# Patient Record
Sex: Male | Born: 1960 | Race: Black or African American | Hispanic: No | Smoking: Never smoker
Health system: Southern US, Community
[De-identification: ages and names within clinical notes are randomized; demographics above are authoritative.]

## PROBLEM LIST (undated history)

## (undated) DIAGNOSIS — I10 Essential (primary) hypertension: Secondary | ICD-10-CM

## (undated) DIAGNOSIS — E119 Type 2 diabetes mellitus without complications: Secondary | ICD-10-CM

## (undated) DIAGNOSIS — E785 Hyperlipidemia, unspecified: Secondary | ICD-10-CM

## (undated) HISTORY — DX: Hyperlipidemia, unspecified: E78.5

## (undated) HISTORY — DX: Type 2 diabetes mellitus without complications: E11.9

## (undated) HISTORY — DX: Essential (primary) hypertension: I10

---

## 2005-10-08 ENCOUNTER — Emergency Department (HOSPITAL_COMMUNITY): Admission: EM | Admit: 2005-10-08 | Discharge: 2005-10-08 | Payer: Self-pay | Admitting: Emergency Medicine

## 2007-09-03 ENCOUNTER — Emergency Department (HOSPITAL_COMMUNITY): Admission: EM | Admit: 2007-09-03 | Discharge: 2007-09-03 | Payer: Self-pay | Admitting: Family Medicine

## 2007-09-06 ENCOUNTER — Emergency Department (HOSPITAL_COMMUNITY): Admission: EM | Admit: 2007-09-06 | Discharge: 2007-09-06 | Payer: Self-pay | Admitting: Family Medicine

## 2012-04-01 ENCOUNTER — Other Ambulatory Visit: Payer: Self-pay | Admitting: Family Medicine

## 2012-04-17 ENCOUNTER — Other Ambulatory Visit: Payer: Self-pay | Admitting: Internal Medicine

## 2012-04-17 MED ORDER — LISINOPRIL-HYDROCHLOROTHIAZIDE 20-12.5 MG PO TABS: 1.0000 | ORAL_TABLET | Freq: Every day | ORAL | Status: DC

## 2012-04-17 MED ORDER — METFORMIN HCL ER 500 MG PO TB24: 500.0000 mg | ORAL_TABLET | Freq: Two times a day (BID) | ORAL | Status: DC

## 2012-04-17 NOTE — Telephone Encounter (Signed)
Pt is stating that Dr Alycia Rossetti only see him once a year his is almost out of his clonapin and will be out of his 2 other rx in the next couple of weeks he was told by pharmacy that he needs to contact us. So he would like to know what he needs to do, if he needs to be seen in office before rx can be filled, he would like someone to contact him to let him know. He states that he doesn't want to abruptly stop his medication.

## 2012-04-17 NOTE — Telephone Encounter (Signed)
Spoke with patient and he understands need for recheck.  Unable to do so until early next month because of work.  Sent in enough meds for 1 month until he can come in.

## 2012-05-16 ENCOUNTER — Ambulatory Visit (INDEPENDENT_AMBULATORY_CARE_PROVIDER_SITE_OTHER): Payer: 59 | Admitting: Family Medicine

## 2012-05-16 VITALS — BP 168/117 | HR 111 | Temp 98.5°F | Resp 17 | Ht 69.0 in | Wt 268.0 lb

## 2012-05-16 DIAGNOSIS — E119 Type 2 diabetes mellitus without complications: Secondary | ICD-10-CM

## 2012-05-16 DIAGNOSIS — I10 Essential (primary) hypertension: Secondary | ICD-10-CM

## 2012-05-16 LAB — POCT GLYCOSYLATED HEMOGLOBIN (HGB A1C): Hemoglobin A1C: 8.3

## 2012-05-16 LAB — GLUCOSE, POCT (MANUAL RESULT ENTRY): POC Glucose: 131 mg/dl — AB (ref 70–99)

## 2012-05-16 MED ORDER — METFORMIN HCL ER 500 MG PO TB24: 500.0000 mg | ORAL_TABLET | Freq: Two times a day (BID) | ORAL | Status: DC

## 2012-05-16 MED ORDER — CLONIDINE HCL 0.2 MG PO TABS: 0.2000 mg | ORAL_TABLET | Freq: Two times a day (BID) | ORAL | Status: DC

## 2012-05-16 MED ORDER — TRIAMTERENE-HCTZ 37.5-25 MG PO TABS: 1.0000 | ORAL_TABLET | Freq: Every day | ORAL | Status: DC

## 2012-05-16 NOTE — Patient Instructions (Signed)
Let me know if cough persists.  Let's recheck things in 4 months.   Hypertension As your heart beats, it forces blood through your arteries. This force is your blood pressure. If the pressure is too high, it is called hypertension (HTN) or high blood pressure. HTN is dangerous because you may have it and not know it. High blood pressure may mean that your heart has to work harder to pump blood. Your arteries may be narrow or stiff. The extra work puts you at risk for heart disease, stroke, and other problems.  Blood pressure consists of two numbers, a higher number over a lower, 110/72, for example. It is stated as "110 over 72." The ideal is below 120 for the top number (systolic) and under 80 for the bottom (diastolic). Write down your blood pressure today. You should pay close attention to your blood pressure if you have certain conditions such as:  Heart failure.   Prior heart attack.   Diabetes   Chronic kidney disease.   Prior stroke.   Multiple risk factors for heart disease.  To see if you have HTN, your blood pressure should be measured while you are seated with your arm held at the level of the heart. It should be measured at least twice. A one-time elevated blood pressure reading (especially in the Emergency Department) does not mean that you need treatment. There may be conditions in which the blood pressure is different between your right and left arms. It is important to see your caregiver soon for a recheck. Most people have essential hypertension which means that there is not a specific cause. This type of high blood pressure may be lowered by changing lifestyle factors such as:  Stress.   Smoking.   Lack of exercise.   Excessive weight.   Drug/tobacco/alcohol use.   Eating less salt.  Most people do not have symptoms from high blood pressure until it has caused damage to the body. Effective treatment can often prevent, delay or reduce that damage. TREATMENT  When a  cause has been identified, treatment for high blood pressure is directed at the cause. There are a large number of medications to treat HTN. These fall into several categories, and your caregiver will help you select the medicines that are best for you. Medications may have side effects. You should review side effects with your caregiver. If your blood pressure stays high after you have made lifestyle changes or started on medicines,   Your medication(s) may need to be changed.   Other problems may need to be addressed.   Be certain you understand your prescriptions, and know how and when to take your medicine.   Be sure to follow up with your caregiver within the time frame advised (usually within two weeks) to have your blood pressure rechecked and to review your medications.   If you are taking more than one medicine to lower your blood pressure, make sure you know how and at what times they should be taken. Taking two medicines at the same time can result in blood pressure that is too low.  SEEK IMMEDIATE MEDICAL CARE IF:  You develop a severe headache, blurred or changing vision, or confusion.   You have unusual weakness or numbness, or a faint feeling.   You have severe chest or abdominal pain, vomiting, or breathing problems.  MAKE SURE YOU:   Understand these instructions.   Will watch your condition.   Will get help right away if you are  not doing well or get worse.  Document Released: 11/22/2005 Document Revised: 11/11/2011 Document Reviewed: 07/12/2008 [facility] Patient Information 2012 [place], [place].

## 2012-05-16 NOTE — Progress Notes (Signed)
@[email or web address] @  Patient ID: Joe Love MRN: [id number], DOB: 1961/03/31, 51 y.o. Date of Encounter: 05/16/2012, 5:44 PM  Primary Physician: No primary provider on file.  Chief Complaint: Medication refill   HPI: 51 y.o. year old male with history below presents for refill of metformin, and blood pressure medicine Doing well without issues or complaints except for dry cough. Taking medication daily without adverse effects. Has been on medication for over 5 years. He does have a family history of high blood pressure and diabetes his brother having 2 amputations. His sister died last 01-08-2023 but results of the autopsy are still pending. She may have had an ulcer.  Past Medical History  Diagnosis Date  . Hypertension      Home Meds: Prior to Admission medications   Medication Sig Start Date End Date Taking? Authorizing Provider  cloNIDine (CATAPRES) 0.2 MG tablet Take 1 tablet (0.2 mg total) by mouth 2 (two) times daily. 05/16/12  Yes Elvina Sidle, MD  metFORMIN (GLUCOPHAGE-XR) 500 MG 24 hr tablet Take 1 tablet (500 mg total) by mouth 2 (two) times daily. NEEDS OFFICE VISIT FOR MORE 05/16/12  Yes Elvina Sidle, MD  triamterene-hydrochlorothiazide (MAXZIDE-25) 37.5-25 MG per tablet Take 1 each (1 tablet total) by mouth daily. 05/16/12 05/16/13  Elvina Sidle, MD    Allergies: No Known Allergies  History   Social History  . Marital Status: Divorced    Spouse Name: N/A    Number of Children: N/A  . Years of Education: N/A   Occupational History  . Not on file.   Social History Main Topics  . Smoking status: Never Smoker   . Smokeless tobacco: Not on file  . Alcohol Use: Not on file  . Drug Use: Not on file  . Sexually Active: Not on file   Other Topics Concern  . Not on file   Social History Narrative  . No narrative on file     Review of Systems: Constitutional: negative for chills, fever, night sweats, weight changes, or fatigue  HEENT: negative for vision changes,  hearing loss, congestion, rhinorrhea, ST, epistaxis, or sinus pressure Cardiovascular: negative for chest pain or palpitations Respiratory: negative for hemoptysis, wheezing, shortness of breath Abdominal: negative for abdominal pain, nausea, vomiting, diarrhea, or constipation Dermatological: negative for rash Neurologic: negative for headache, dizziness, or syncope All other systems reviewed and are otherwise negative with the exception to those above and in the HPI.   Physical Exam:  BP recheck 120/98 Blood pressure 168/117, pulse 111, temperature 98.5 F (36.9 C), temperature source Oral, resp. rate 17, height 5\' 9"  (1.753 m), weight 268 lb (121.564 kg)., Body mass index is 39.58 kg/(m^2). General: Well developed, well nourished, in no acute distress. Head: Normocephalic, atraumatic, eyes without discharge, sclera non-icteric, nares are without discharge. Bilateral auditory canals clear, TM's are without perforation, pearly grey and translucent with reflective cone of light bilaterally. Oral cavity moist, posterior pharynx without exudate, erythema, peritonsillar abscess, or post nasal drip.  Neck: Supple. No thyromegaly. Full ROM. No lymphadenopathy. Lungs: Clear bilaterally to auscultation without wheezes, rales, or rhonchi. Breathing is unlabored. Heart: RRR with S1 S2. No murmurs, rubs, or gallops appreciated. Abdomen: Soft, non-tender, non-distended with normoactive bowel sounds. No hepatosplenomegalymegaly. No rebound/guarding. No obvious abdominal masses. Msk:  Strength and tone normal for age. Extremities/Skin: Warm and dry. No clubbing or cyanosis. No edema. No rashes or suspicious lesions. Neuro: Alert and oriented X 3. Moves all extremities spontaneously. Gait is normal. CNII-XII grossly in tact. Psych:  Responds to questions appropriately with a normal affect.   Labs:  pending   ASSESSMENT AND PLAN:  51 y.o. year old male with hypertension and type 2 diabeytes here for  medication refill. The lisinopril and seems to be causing the dry cough. - 1. Hypertension  cloNIDine (CATAPRES) 0.2 MG tablet, triamterene-hydrochlorothiazide (MAXZIDE-25) 37.5-25 MG per tablet, POCT glucose (manual entry), Lipid panel  2. Diabetes mellitus  metFORMIN (GLUCOPHAGE-XR) 500 MG 24 hr tablet, POCT glucose (manual entry), POCT glycosylated hemoglobin (Hb A1C), Comprehensive metabolic panel, Lipid panel     Signed, Elvina Sidle, MD 05/16/2012 5:44 PM

## 2012-05-16 NOTE — Progress Notes (Signed)
     Objective:  120/95 left arm

## 2012-05-17 LAB — COMPREHENSIVE METABOLIC PANEL
ALT: 24 U/L (ref 0–53)
AST: 19 U/L (ref 0–37)
Albumin: 4.5 g/dL (ref 3.5–5.2)
Alkaline Phosphatase: 62 U/L (ref 39–117)
BUN: 11 mg/dL (ref 6–23)
CO2: 28 mEq/L (ref 19–32)
Calcium: 9.7 mg/dL (ref 8.4–10.5)
Chloride: 104 mEq/L (ref 96–112)
Creat: 1.03 mg/dL (ref 0.50–1.35)
Glucose, Bld: 134 mg/dL — ABNORMAL HIGH (ref 70–99)
Potassium: 4.8 mEq/L (ref 3.5–5.3)
Sodium: 141 mEq/L (ref 135–145)
Total Bilirubin: 0.6 mg/dL (ref 0.3–1.2)
Total Protein: 7.2 g/dL (ref 6.0–8.3)

## 2012-05-17 LAB — LIPID PANEL
Cholesterol: 253 mg/dL — ABNORMAL HIGH (ref 0–200)
HDL: 35 mg/dL — ABNORMAL LOW (ref >39)
LDL Cholesterol: 181 mg/dL — ABNORMAL HIGH (ref 0–99)
Total CHOL/HDL Ratio: 7.2 Ratio
Triglycerides: 184 mg/dL — ABNORMAL HIGH (ref <150)
VLDL: 37 mg/dL (ref 0–40)

## 2012-05-19 ENCOUNTER — Other Ambulatory Visit: Payer: Self-pay | Admitting: Family Medicine

## 2012-05-19 MED ORDER — PRAVASTATIN SODIUM 20 MG PO TABS: 20.0000 mg | ORAL_TABLET | Freq: Every day | ORAL | Status: DC

## 2013-01-01 ENCOUNTER — Other Ambulatory Visit: Payer: Self-pay | Admitting: Family Medicine

## 2013-01-02 ENCOUNTER — Other Ambulatory Visit: Payer: Self-pay | Admitting: Family Medicine

## 2013-02-05 ENCOUNTER — Telehealth: Payer: Self-pay

## 2013-02-05 DIAGNOSIS — I10 Essential (primary) hypertension: Secondary | ICD-10-CM

## 2013-02-05 NOTE — Telephone Encounter (Signed)
PT STATES THAT HE IS UNINSURED RIGHT NOW AND IS IN NEED OF THE FOLLOWING MEDICATIONS:  1.)METFORMIN  2.)CLONIDINE 3.)PROVASTIN  3.)B/P MEDICATION (PT UNSURE OF THE NAME)     PHARMACY: [facility]  BEST# [phone]  PT STATES THAT HE WILL BE ABLE TO GET AN APPT. IN ABOUT A MONTH BECAUSE HE SHOULD BE INSURED BY THAT TIME.

## 2013-02-06 MED ORDER — METFORMIN HCL ER 500 MG PO TB24: 500.0000 mg | ORAL_TABLET | Freq: Two times a day (BID) | ORAL | Status: DC

## 2013-02-06 MED ORDER — TRIAMTERENE-HCTZ 37.5-25 MG PO TABS: 1.0000 | ORAL_TABLET | Freq: Every day | ORAL | Status: DC

## 2013-02-06 MED ORDER — PRAVASTATIN SODIUM 20 MG PO TABS: 20.0000 mg | ORAL_TABLET | Freq: Every day | ORAL | Status: DC

## 2013-02-06 NOTE — Telephone Encounter (Signed)
Lm for [employer]. To call me back, need to know what BP med he is requesting.

## 2013-02-06 NOTE — Telephone Encounter (Signed)
Sent in for him until he can come in next month.

## 2013-03-12 ENCOUNTER — Telehealth: Payer: Self-pay

## 2013-03-12 DIAGNOSIS — I10 Essential (primary) hypertension: Secondary | ICD-10-CM

## 2013-03-12 NOTE — Telephone Encounter (Signed)
Patient called again

## 2013-03-12 NOTE — Telephone Encounter (Signed)
error 

## 2013-03-12 NOTE — Telephone Encounter (Signed)
.  patient wants to know if we can renew his meds for one more month, he is waiting on his insurance, he lost his coverage with recent health care reforms, he should be okay by next month, pended. He is completely out of the Metformin and the Triamteren

## 2013-03-12 NOTE — Telephone Encounter (Signed)
Pt would like a refill on metformin, pt also is aware that he may need an OV but he does not have insurase right now but will hopefully have some by the end of the month. Best# [phone]  [place] [phone])

## 2013-03-13 MED ORDER — METFORMIN HCL ER 500 MG PO TB24: 500.0000 mg | ORAL_TABLET | Freq: Two times a day (BID) | ORAL | Status: DC

## 2013-03-13 MED ORDER — TRIAMTERENE-HCTZ 37.5-25 MG PO TABS: 1.0000 | ORAL_TABLET | Freq: Every day | ORAL | Status: DC

## 2013-03-13 NOTE — Telephone Encounter (Signed)
Patient advised.

## 2013-03-13 NOTE — Telephone Encounter (Signed)
Sent to pharmacy.  Pt last seen 9 months ago, needs visit/labs for more RFs

## 2013-04-11 ENCOUNTER — Ambulatory Visit: Payer: Self-pay | Admitting: Family Medicine

## 2013-04-11 VITALS — BP 173/125 | HR 122 | Temp 98.2°F | Resp 16 | Ht 70.5 in | Wt 274.0 lb

## 2013-04-11 DIAGNOSIS — I10 Essential (primary) hypertension: Secondary | ICD-10-CM

## 2013-04-11 DIAGNOSIS — R739 Hyperglycemia, unspecified: Secondary | ICD-10-CM

## 2013-04-11 DIAGNOSIS — E785 Hyperlipidemia, unspecified: Secondary | ICD-10-CM

## 2013-04-11 MED ORDER — CLONIDINE HCL 0.2 MG PO TABS: 0.2000 mg | ORAL_TABLET | Freq: Two times a day (BID) | ORAL | Status: DC

## 2013-04-11 MED ORDER — METFORMIN HCL 1000 MG PO TABS: 1000.0000 mg | ORAL_TABLET | Freq: Every day | ORAL | Status: DC

## 2013-04-11 MED ORDER — ATORVASTATIN CALCIUM 20 MG PO TABS: 20.0000 mg | ORAL_TABLET | Freq: Every day | ORAL | Status: DC

## 2013-04-11 NOTE — Patient Instructions (Addendum)

## 2013-04-11 NOTE — Progress Notes (Signed)
Patient ID: Joe Love MRN: [id number], DOB: 19-Nov-1961, 52 y.o. Date of Encounter: 04/11/2013, 6:35 PM  Primary Physician: Elvina Sidle, MD  Chief Complaint: HTN  HPI: 52 y.o. year old male with history below presents for hypertension follow up.  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  BP at pharmacy is 125/90   No CP, HA, visual changes, or focal deficits.   Past Medical History  Diagnosis Date  . Hypertension   . Hyperlipidemia   . Diabetes mellitus without complication      Home Meds: Prior to Admission medications   Medication Sig Start Date End Date Taking? Authorizing Provider  cloNIDine (CATAPRES) 0.2 MG tablet Take 1 tablet (0.2 mg total) by mouth 2 (two) times daily. 05/16/12  Yes Elvina Sidle, MD  metFORMIN (GLUCOPHAGE-XR) 500 MG 24 hr tablet Take 1 tablet (500 mg total) by mouth 2 (two) times daily. Needs office visit 03/12/13  Yes Eleanore Delia Chimes, PA-C  pravastatin (PRAVACHOL) 20 MG tablet Take 1 tablet (20 mg total) by mouth daily. 02/06/13 02/06/14 Yes Heather M Marte, PA-C  triamterene-hydrochlorothiazide (MAXZIDE-25) 37.5-25 MG per tablet Take 1 each (1 tablet total) by mouth daily. 03/12/13 03/12/14 Yes Eleanore Delia Chimes, PA-C    Allergies: No Known Allergies  History   Social History  . Marital Status: Divorced    Spouse Name: N/A    Number of Children: N/A  . Years of Education: N/A   Occupational History  . Not on file.   Social History Main Topics  . Smoking status: Never Smoker   . Smokeless tobacco: Not on file  . Alcohol Use: Yes     Comment: occasionally  . Drug Use: No  . Sexually Active: Not Currently   Other Topics Concern  . Not on file   Social History Narrative  . No narrative on file     Family History  Problem Relation Age of Onset  . Kidney disease Mother   . Hypertension Mother   . Diabetes Brother     Review of Systems: Constitutional: negative for chills,  fever, night sweats, weight changes, or fatigue  HEENT: negative for vision changes, hearing loss, congestion, rhinorrhea, ST, epistaxis, or sinus pressure Cardiovascular: negative for chest pain, palpitations, or DOE Respiratory: negative for hemoptysis, wheezing, shortness of breath, or cough Abdominal: negative for abdominal pain, nausea, vomiting, diarrhea, or constipation Dermatological: negative for rash Neurologic: negative for headache, dizziness, or syncope All other systems reviewed and are otherwise negative with the exception to those above and in the HPI.   Physical Exam Blood pressure 173/125, pulse 122, temperature 98.2 F (36.8 C), temperature source Oral, resp. rate 16, height 5' 10.5" (1.791 m), weight 274 lb (124.286 kg), SpO2 96.00%., Body mass index is 38.75 kg/(m^2). General: Well developed, well nourished, in no acute distress. Head: Normocephalic, atraumatic, eyes without discharge, sclera non-icteric, nares are without discharge. Bilateral auditory canals clear, TM's are without perforation, pearly grey and translucent with reflective cone of light bilaterally. Oral cavity moist, posterior pharynx without exudate, erythema, peritonsillar abscess, or post nasal drip.  Neck: Supple. No thyromegaly. Full ROM. No lymphadenopathy. No carotid bruits. Lungs: Clear bilaterally to auscultation without wheezes, rales, or rhonchi. Breathing is unlabored. Heart: RRR with S1 S2. No murmurs, rubs, or gallops appreciated.  Abdomen: Soft, non-tender, non-distended with normoactive bowel sounds. No hepatosplenomegaly. No rebound/guarding. No obvious abdominal masses. Msk:  Strength and tone normal  for age. Extremities/Skin: Warm and dry. No clubbing or cyanosis. No edema. No rashes or suspicious lesions. Distal pulses 2+ and equal bilaterally. Neuro: Alert and oriented X 3. Moves all extremities spontaneously. Gait is normal. CNII-XII grossly in tact. DTR 2+, cerebellar function intact.  Rhomberg normal. Psych:  Responds to questions appropriately with a normal affect.   Monofilament: good sensation.  ASSESSMENT AND PLAN:  52 y.o. year old male with hypertension and superimposed white coat syndrome.  He is short of money and anticipates getting insurance in next 2 months. Hyperlipidemia - Plan: atorvastatin (LIPITOR) 20 MG tablet  Hypertension - Plan: cloNIDine (CATAPRES) 0.2 MG tablet  Hyperglycemia - Plan: metFORMIN (GLUCOPHAGE) 1000 MG tablet  He will work on weight loss -  Signed, Elvina Sidle, MD 04/11/2013 6:35 PM

## 2013-04-14 ENCOUNTER — Other Ambulatory Visit: Payer: Self-pay | Admitting: Physician Assistant

## 2013-04-16 ENCOUNTER — Telehealth: Payer: Self-pay

## 2013-04-16 NOTE — Telephone Encounter (Signed)
Yes, we can change back to pravastatin, where does he want this to be sent?

## 2013-04-16 NOTE — Telephone Encounter (Signed)
Called him to advise.left message for him to le to me know and I can send this in for him.

## 2013-04-16 NOTE — Telephone Encounter (Signed)
PT STATES THE DR HAD CHANGED HIS BP MEDICINE TO LIPITOR SINCE THE OTHER WAS REALLY EXPENSIVE, HOWEVER THE LIPITOR IS TO EXPENSIVE ALSO. WOULD LIKE TO GO BACK TO THE OTHER ONE SINCE HE FOUND WHERE HE MAY CAN GET IT LESS EXPENSIVE PLEASE CALL [phone] AND HE HAVE ENOUGH, BUT JUST WANTED THE DR TO KNOW

## 2013-04-18 MED ORDER — PRAVASTATIN SODIUM 20 MG PO TABS: 20.0000 mg | ORAL_TABLET | Freq: Every day | ORAL | Status: DC

## 2013-04-18 NOTE — Telephone Encounter (Signed)
Sent in to [facility]

## 2013-06-14 ENCOUNTER — Other Ambulatory Visit: Payer: Self-pay | Admitting: Physician Assistant

## 2013-07-14 ENCOUNTER — Other Ambulatory Visit: Payer: Self-pay | Admitting: Physician Assistant

## 2013-07-14 NOTE — Telephone Encounter (Signed)
Patient called and stated he is out of his BP meds  Dr L prescribed. Rx showed no refills yet all other meds had year refill. Please let patient know ASAP when this has been called in. Patient also stated [facility]) had called office. Patient's best phone # [phone]

## 2013-10-10 ENCOUNTER — Other Ambulatory Visit: Payer: Self-pay | Admitting: Radiology

## 2013-10-10 MED ORDER — TRIAMTERENE-HCTZ 37.5-25 MG PO TABS: ORAL_TABLET | ORAL | Status: DC

## 2013-10-10 NOTE — Telephone Encounter (Signed)
Patient asking for refills on his BP Meds until May, pended please advise (I can not send in this may refills without your approval)

## 2014-04-17 ENCOUNTER — Other Ambulatory Visit: Payer: Self-pay | Admitting: Family Medicine

## 2014-04-17 ENCOUNTER — Telehealth: Payer: Self-pay

## 2014-04-17 NOTE — Telephone Encounter (Signed)
Rx request was sent from pharm. Sent in 30 day

## 2014-04-17 NOTE — Telephone Encounter (Signed)
Pt would like to know if there is anyway Dr.Lauenstein could help him get a months worth of his diabetes medicine metformin, he no longer has insurance, and has been off of medicine for past two days, and not feeling well.  [phone]

## 2014-04-19 ENCOUNTER — Other Ambulatory Visit: Payer: Self-pay | Admitting: Family Medicine

## 2014-05-01 ENCOUNTER — Other Ambulatory Visit: Payer: Self-pay | Admitting: Family Medicine

## 2014-05-06 ENCOUNTER — Other Ambulatory Visit: Payer: Self-pay | Admitting: Family Medicine

## 2014-05-06 ENCOUNTER — Other Ambulatory Visit: Payer: Self-pay | Admitting: Physician Assistant

## 2014-06-30 ENCOUNTER — Ambulatory Visit (INDEPENDENT_AMBULATORY_CARE_PROVIDER_SITE_OTHER): Payer: Self-pay | Admitting: Family Medicine

## 2014-06-30 VITALS — BP 190/102 | HR 124 | Temp 98.3°F | Resp 16 | Ht 69.5 in | Wt 276.8 lb

## 2014-06-30 DIAGNOSIS — E118 Type 2 diabetes mellitus with unspecified complications: Secondary | ICD-10-CM

## 2014-06-30 DIAGNOSIS — I10 Essential (primary) hypertension: Secondary | ICD-10-CM

## 2014-06-30 DIAGNOSIS — E785 Hyperlipidemia, unspecified: Secondary | ICD-10-CM

## 2014-06-30 LAB — GLUCOSE, POCT (MANUAL RESULT ENTRY): POC Glucose: 240 mg/dl — AB (ref 70–99)

## 2014-06-30 MED ORDER — TRIAMTERENE-HCTZ 37.5-25 MG PO TABS: ORAL_TABLET | ORAL | Status: DC

## 2014-06-30 MED ORDER — CLONIDINE HCL 0.2 MG PO TABS: 0.2000 mg | ORAL_TABLET | Freq: Two times a day (BID) | ORAL | Status: AC

## 2014-06-30 MED ORDER — METFORMIN HCL 1000 MG PO TABS: 1000.0000 mg | ORAL_TABLET | Freq: Every day | ORAL | Status: AC

## 2014-06-30 MED ORDER — PRAVASTATIN SODIUM 20 MG PO TABS: ORAL_TABLET | ORAL | Status: AC

## 2014-06-30 NOTE — Progress Notes (Addendum)
Patient ID: Joe Love MRN: [id number], DOB: 1961/04/28, 53 y.o. Date of Encounter: 06/30/2014, 11:53 AM  Primary Physician: Elvina SidleLAUENSTEIN,Gennie Dib, MD  Chief Complaint: HTN  HPI: 53 y.o. year old male with history below presents for hypertension follow up.  He ran out of medicine 3 weeks ago and has no health insurance  No CP, HA, visual changes, or focal deficits.   Patient works in a [employer]. His 2 daughters both of whom are doing well, the older of which is going to [facility] school [facility] [employer]  Past Medical History  Diagnosis Date  . Hypertension   . Hyperlipidemia   . Diabetes mellitus without complication      Home Meds: Prior to Admission medications   Medication Sig Start Date End Date Taking? Authorizing Provider  cloNIDine (CATAPRES) 0.2 MG tablet TAKE ONE TABLET BY MOUTH TWICE DAILY 05/01/14  Yes Morrell RiddleSarah L Weber, PA-C  metFORMIN (GLUCOPHAGE) 1000 MG tablet Take 1 tablet (1,000 mg total) by mouth daily. 04/17/14  Yes Elvina SidleKurt Sophina Mitten, MD  pravastatin (PRAVACHOL) 20 MG tablet Take 1 tablet (20 mg total) by mouth daily. PATIENT NEEDS OFFICE VISIT FOR ADDITIONAL REFILLS   Yes Eleanore E Egan, PA-C  triamterene-hydrochlorothiazide (MAXZIDE-25) 37.5-25 MG per tablet Take 1 tablet by mouth daily. PATIENT NEEDS OFFICE VISIT FOR ADDITIONAL REFILLS   Yes Godfrey PickEleanore E Egan, PA-C    Allergies:  Allergies  Allergen Reactions  . Shrimp [Shellfish Allergy] Shortness Of Breath    History   Social History  . Marital Status: Divorced    Spouse Name: N/A    Number of Children: N/A  . Years of Education: N/A   Occupational History  . Not on file.   Social History Main Topics  . Smoking status: Never Smoker   . Smokeless tobacco: Not on file  . Alcohol Use: Yes     Comment: occasionally  . Drug Use: No  . Sexual Activity: Not Currently   Other Topics Concern  . Not on file   Social History Narrative  . No narrative on file     Family History  Problem Relation Age of  Onset  . Kidney disease Mother   . Hypertension Mother   . Diabetes Brother     Review of Systems: Constitutional: negative for chills, fever, night sweats, weight changes, or fatigue  HEENT: negative for vision changes, hearing loss, congestion, rhinorrhea, ST, epistaxis, or sinus pressure Cardiovascular: negative for chest pain, palpitations, or DOE Respiratory: negative for hemoptysis, wheezing, shortness of breath, or cough Abdominal: negative for abdominal pain, nausea, vomiting, diarrhea, or constipation Dermatological: negative for rash Neurologic: negative for headache, dizziness, or syncope All other systems reviewed and are otherwise negative with the exception to those above and in the HPI.   Physical Exam: Blood pressure 190/102, pulse 124, temperature 98.3 F (36.8 C), temperature source Oral, resp. rate 16, height 5' 9.5" (1.765 m), weight 276 lb 12.8 oz (125.556 kg), SpO2 95.00%., Body mass index is 40.3 kg/(m^2). BP Readings from Last 3 Encounters:  06/30/14 190/102  04/11/13 173/125  05/16/12 168/117   Wt Readings from Last 3 Encounters:  06/30/14 276 lb 12.8 oz (125.556 kg)  04/11/13 274 lb (124.286 kg)  05/16/12 268 lb (121.564 kg)   General: Well developed, well nourished, in no acute distress. Head: Normocephalic, atraumatic, eyes without discharge, sclera non-icteric, nares are without discharge. Bilateral auditory canals clear, TM's are without perforation, pearly grey and translucent with reflective cone of light bilaterally. Oral cavity moist, posterior  pharynx without exudate, erythema, peritonsillar abscess, or post nasal drip.  Neck: Supple. No thyromegaly. Full ROM. No lymphadenopathy. No carotid bruits. Lungs: Clear bilaterally to auscultation without wheezes, rales, or rhonchi. Breathing is unlabored. Heart: RRR with S1 S2. No murmurs, rubs, or gallops appreciated.  Abdomen: Soft, non-tender, non-distended with normoactive bowel sounds. No  hepatosplenomegaly. No rebound/guarding. No obvious abdominal masses. Msk:  Strength and tone normal for age. Extremities/Skin: Warm and dry. No clubbing or cyanosis. No edema. No rashes or suspicious lesions. Distal pulses 2+ and equal bilaterally. Neuro: Alert and oriented X 3. Moves all extremities spontaneously. Gait is normal. CNII-XII grossly in tact. DTR 2+, cerebellar function intact. Rhomberg normal. Psych:  Responds to questions appropriately with a normal affect.   Labs: Lab Results  Component Value Date   HGBA1C 8.3 05/16/2012   Results for orders placed in visit on 06/30/14  GLUCOSE, POCT (MANUAL RESULT ENTRY)      Result Value Ref Range   POC Glucose 240 (*) 70 - 99 mg/dl      ASSESSMENT AND PLAN:  53 y.o. year old male with hypertension.  He is applying for [employer] and will return for CPE in a month or so. -Type 2 diabetes mellitus with complication - Plan: POCT glucose (manual entry), metFORMIN (GLUCOPHAGE) 1000 MG tablet  Essential hypertension - Plan: cloNIDine (CATAPRES) 0.2 MG tablet, triamterene-hydrochlorothiazide (MAXZIDE-25) 37.5-25 MG per tablet  Other and unspecified hyperlipidemia - Plan: pravastatin (PRAVACHOL) 20 MG tablet  I've asked Joe Love to increase metformin to bid  Signed, Elvina Sidle, MD 06/30/2014 11:53 AM

## 2014-07-29 ENCOUNTER — Other Ambulatory Visit: Payer: Self-pay | Admitting: Family Medicine

## 2014-12-28 ENCOUNTER — Other Ambulatory Visit: Payer: Self-pay | Admitting: Family Medicine

## 2014-12-28 NOTE — Telephone Encounter (Signed)
Patient called answering service requesting refill of Triamterene-HCTZ.  Spoke with patient; one month refill of medication approved and escribed to pharmacy.  Pt advised that he is due for follow-up of DMII and HTN.  Only one month rx provided.

## 2015-01-28 ENCOUNTER — Telehealth: Payer: Self-pay

## 2015-01-28 ENCOUNTER — Other Ambulatory Visit: Payer: Self-pay | Admitting: Family Medicine

## 2015-01-28 DIAGNOSIS — I10 Essential (primary) hypertension: Secondary | ICD-10-CM

## 2015-01-28 NOTE — Telephone Encounter (Signed)
Can we refill? 

## 2015-01-28 NOTE — Telephone Encounter (Signed)
Pt states that Dr.L gave him a year supply of triamterene-hydrochlorothiazide (MAXZIDE-25) 37.5-25 MG per tablet [[id number]][[id number]]. The pharmacy does not have this on file. He states Dr.L did this because he is not able to afford insurance at this time. He is completely out of this medication, and he states, " It is what is keeping him alive." Please advise

## 2015-01-29 MED ORDER — TRIAMTERENE-HCTZ 37.5-25 MG PO TABS: 1.0000 | ORAL_TABLET | Freq: Every day | ORAL | Status: DC

## 2015-01-29 NOTE — Telephone Encounter (Signed)
Dr L, I can only give pt 1 mos Rf w/note to RTC. Do you want to RF for longer?

## 2015-01-29 NOTE — Telephone Encounter (Signed)
I reviewed this patient's chart. At his last visit in 07/2014, Dr. Milus GlazierLauenstein prescribed this medication, #30, RF x 4. I do not see any communication that he intended to refill it for a year.  I have sent a 30-day supply, and will leave this message for Dr. Milus GlazierLauenstein to review upon his return on Friday 2/26. If it was his intention to refill the medication for a year, he can send in the remaining refills and clarify the follow-up plan.  Meds ordered this encounter  Medications  . triamterene-hydrochlorothiazide (MAXZIDE-25) 37.5-25 MG per tablet    Sig: Take 1 tablet by mouth daily.    Dispense:  30 tablet    Refill:  0    Order Specific Question:  Supervising Provider    Answer:  DOOLITTLE, ROBERT P [[id number]]

## 2015-01-30 NOTE — Telephone Encounter (Signed)
Left a detailed message with the message below from [place]. Dr. Elbert EwingsL can I refill for a year? Please advise.

## 2015-01-31 MED ORDER — TRIAMTERENE-HCTZ 37.5-25 MG PO TABS: 1.0000 | ORAL_TABLET | Freq: Every day | ORAL | Status: AC

## 2015-02-03 NOTE — Telephone Encounter (Signed)
Left a message that Rx was sent in for a year.

## 2015-06-03 ENCOUNTER — Encounter (HOSPITAL_COMMUNITY): Admission: EM | Disposition: E | Payer: Self-pay | Source: Home / Self Care | Attending: Cardiovascular Disease

## 2015-06-03 ENCOUNTER — Telehealth: Payer: Self-pay | Admitting: Radiology

## 2015-06-03 ENCOUNTER — Encounter (HOSPITAL_COMMUNITY): Payer: Self-pay

## 2015-06-03 ENCOUNTER — Emergency Department (HOSPITAL_COMMUNITY): Payer: Medicaid Other

## 2015-06-03 ENCOUNTER — Inpatient Hospital Stay (HOSPITAL_COMMUNITY)
Admission: EM | Admit: 2015-06-03 | Discharge: 2015-07-07 | DRG: 246 | Disposition: E | Payer: Medicaid Other | Attending: Cardiovascular Disease | Admitting: Cardiovascular Disease

## 2015-06-03 DIAGNOSIS — D72829 Elevated white blood cell count, unspecified: Secondary | ICD-10-CM

## 2015-06-03 DIAGNOSIS — E118 Type 2 diabetes mellitus with unspecified complications: Secondary | ICD-10-CM | POA: Diagnosis present

## 2015-06-03 DIAGNOSIS — R000 Tachycardia, unspecified: Secondary | ICD-10-CM | POA: Diagnosis present

## 2015-06-03 DIAGNOSIS — R059 Cough, unspecified: Secondary | ICD-10-CM

## 2015-06-03 DIAGNOSIS — I513 Intracardiac thrombosis, not elsewhere classified: Secondary | ICD-10-CM | POA: Diagnosis present

## 2015-06-03 DIAGNOSIS — S0990XA Unspecified injury of head, initial encounter: Secondary | ICD-10-CM

## 2015-06-03 DIAGNOSIS — G8194 Hemiplegia, unspecified affecting left nondominant side: Secondary | ICD-10-CM | POA: Diagnosis not present

## 2015-06-03 DIAGNOSIS — N189 Chronic kidney disease, unspecified: Secondary | ICD-10-CM | POA: Diagnosis present

## 2015-06-03 DIAGNOSIS — N179 Acute kidney failure, unspecified: Secondary | ICD-10-CM

## 2015-06-03 DIAGNOSIS — E785 Hyperlipidemia, unspecified: Secondary | ICD-10-CM | POA: Diagnosis present

## 2015-06-03 DIAGNOSIS — I462 Cardiac arrest due to underlying cardiac condition: Secondary | ICD-10-CM | POA: Diagnosis not present

## 2015-06-03 DIAGNOSIS — N170 Acute kidney failure with tubular necrosis: Secondary | ICD-10-CM | POA: Diagnosis present

## 2015-06-03 DIAGNOSIS — I2119 ST elevation (STEMI) myocardial infarction involving other coronary artery of inferior wall: Secondary | ICD-10-CM | POA: Diagnosis not present

## 2015-06-03 DIAGNOSIS — I213 ST elevation (STEMI) myocardial infarction of unspecified site: Secondary | ICD-10-CM | POA: Diagnosis not present

## 2015-06-03 DIAGNOSIS — K264 Chronic or unspecified duodenal ulcer with hemorrhage: Secondary | ICD-10-CM | POA: Diagnosis not present

## 2015-06-03 DIAGNOSIS — E876 Hypokalemia: Secondary | ICD-10-CM | POA: Diagnosis not present

## 2015-06-03 DIAGNOSIS — E875 Hyperkalemia: Secondary | ICD-10-CM | POA: Diagnosis present

## 2015-06-03 DIAGNOSIS — R570 Cardiogenic shock: Secondary | ICD-10-CM | POA: Diagnosis not present

## 2015-06-03 DIAGNOSIS — K269 Duodenal ulcer, unspecified as acute or chronic, without hemorrhage or perforation: Secondary | ICD-10-CM

## 2015-06-03 DIAGNOSIS — Z978 Presence of other specified devices: Secondary | ICD-10-CM

## 2015-06-03 DIAGNOSIS — I5021 Acute systolic (congestive) heart failure: Secondary | ICD-10-CM | POA: Diagnosis not present

## 2015-06-03 DIAGNOSIS — Z6835 Body mass index (BMI) 35.0-35.9, adult: Secondary | ICD-10-CM

## 2015-06-03 DIAGNOSIS — Z79899 Other long term (current) drug therapy: Secondary | ICD-10-CM | POA: Diagnosis not present

## 2015-06-03 DIAGNOSIS — I6340 Cerebral infarction due to embolism of unspecified cerebral artery: Secondary | ICD-10-CM | POA: Diagnosis not present

## 2015-06-03 DIAGNOSIS — K7200 Acute and subacute hepatic failure without coma: Secondary | ICD-10-CM | POA: Diagnosis not present

## 2015-06-03 DIAGNOSIS — I509 Heart failure, unspecified: Secondary | ICD-10-CM

## 2015-06-03 DIAGNOSIS — I472 Ventricular tachycardia, unspecified: Secondary | ICD-10-CM

## 2015-06-03 DIAGNOSIS — I236 Thrombosis of atrium, auricular appendage, and ventricle as current complications following acute myocardial infarction: Secondary | ICD-10-CM | POA: Diagnosis present

## 2015-06-03 DIAGNOSIS — I639 Cerebral infarction, unspecified: Secondary | ICD-10-CM | POA: Diagnosis not present

## 2015-06-03 DIAGNOSIS — I480 Paroxysmal atrial fibrillation: Secondary | ICD-10-CM | POA: Diagnosis not present

## 2015-06-03 DIAGNOSIS — I2111 ST elevation (STEMI) myocardial infarction involving right coronary artery: Secondary | ICD-10-CM

## 2015-06-03 DIAGNOSIS — Z8249 Family history of ischemic heart disease and other diseases of the circulatory system: Secondary | ICD-10-CM

## 2015-06-03 DIAGNOSIS — E872 Acidosis, unspecified: Secondary | ICD-10-CM | POA: Diagnosis present

## 2015-06-03 DIAGNOSIS — Z66 Do not resuscitate: Secondary | ICD-10-CM | POA: Diagnosis not present

## 2015-06-03 DIAGNOSIS — I2121 ST elevation (STEMI) myocardial infarction involving left circumflex coronary artery: Secondary | ICD-10-CM | POA: Diagnosis present

## 2015-06-03 DIAGNOSIS — N19 Unspecified kidney failure: Secondary | ICD-10-CM

## 2015-06-03 DIAGNOSIS — D62 Acute posthemorrhagic anemia: Secondary | ICD-10-CM | POA: Diagnosis not present

## 2015-06-03 DIAGNOSIS — D631 Anemia in chronic kidney disease: Secondary | ICD-10-CM | POA: Diagnosis present

## 2015-06-03 DIAGNOSIS — E871 Hypo-osmolality and hyponatremia: Secondary | ICD-10-CM | POA: Diagnosis present

## 2015-06-03 DIAGNOSIS — D509 Iron deficiency anemia, unspecified: Secondary | ICD-10-CM | POA: Diagnosis present

## 2015-06-03 DIAGNOSIS — T17908A Unspecified foreign body in respiratory tract, part unspecified causing other injury, initial encounter: Secondary | ICD-10-CM

## 2015-06-03 DIAGNOSIS — B9681 Helicobacter pylori [H. pylori] as the cause of diseases classified elsewhere: Secondary | ICD-10-CM | POA: Diagnosis not present

## 2015-06-03 DIAGNOSIS — E1122 Type 2 diabetes mellitus with diabetic chronic kidney disease: Secondary | ICD-10-CM | POA: Diagnosis present

## 2015-06-03 DIAGNOSIS — R0601 Orthopnea: Secondary | ICD-10-CM

## 2015-06-03 DIAGNOSIS — K922 Gastrointestinal hemorrhage, unspecified: Secondary | ICD-10-CM

## 2015-06-03 DIAGNOSIS — I255 Ischemic cardiomyopathy: Secondary | ICD-10-CM | POA: Diagnosis present

## 2015-06-03 DIAGNOSIS — K2970 Gastritis, unspecified, without bleeding: Secondary | ICD-10-CM | POA: Diagnosis not present

## 2015-06-03 DIAGNOSIS — I2510 Atherosclerotic heart disease of native coronary artery without angina pectoris: Secondary | ICD-10-CM | POA: Diagnosis present

## 2015-06-03 DIAGNOSIS — I442 Atrioventricular block, complete: Secondary | ICD-10-CM | POA: Diagnosis not present

## 2015-06-03 DIAGNOSIS — R05 Cough: Secondary | ICD-10-CM

## 2015-06-03 DIAGNOSIS — J9601 Acute respiratory failure with hypoxia: Secondary | ICD-10-CM

## 2015-06-03 DIAGNOSIS — R5383 Other fatigue: Secondary | ICD-10-CM | POA: Diagnosis present

## 2015-06-03 DIAGNOSIS — Z452 Encounter for adjustment and management of vascular access device: Secondary | ICD-10-CM

## 2015-06-03 DIAGNOSIS — Z515 Encounter for palliative care: Secondary | ICD-10-CM | POA: Diagnosis not present

## 2015-06-03 DIAGNOSIS — I129 Hypertensive chronic kidney disease with stage 1 through stage 4 chronic kidney disease, or unspecified chronic kidney disease: Secondary | ICD-10-CM | POA: Diagnosis present

## 2015-06-03 DIAGNOSIS — I2582 Chronic total occlusion of coronary artery: Secondary | ICD-10-CM | POA: Diagnosis present

## 2015-06-03 DIAGNOSIS — J9690 Respiratory failure, unspecified, unspecified whether with hypoxia or hypercapnia: Secondary | ICD-10-CM

## 2015-06-03 HISTORY — PX: CARDIAC CATHETERIZATION: SHX172

## 2015-06-03 LAB — POCT I-STAT, CHEM 8
BUN: 53 mg/dL — AB (ref 6–20)
CREATININE: 4.3 mg/dL — AB (ref 0.61–1.24)
Calcium, Ion: 1.17 mmol/L (ref 1.12–1.23)
Chloride: 90 mmol/L — ABNORMAL LOW (ref 101–111)
GLUCOSE: 133 mg/dL — AB (ref 65–99)
HCT: 41 % (ref 39.0–52.0)
Hemoglobin: 13.9 g/dL (ref 13.0–17.0)
POTASSIUM: 5.6 mmol/L — AB (ref 3.5–5.1)
SODIUM: 117 mmol/L — AB (ref 135–145)
TCO2: 13 mmol/L (ref 0–100)

## 2015-06-03 LAB — BASIC METABOLIC PANEL
Anion gap: 11 (ref 5–15)
Anion gap: 10 (ref 5–15)
BUN: 63 mg/dL — ABNORMAL HIGH (ref 6–20)
BUN: 71 mg/dL — AB (ref 6–20)
CO2: 18 mmol/L — AB (ref 22–32)
CO2: 17 mmol/L — AB (ref 22–32)
Calcium: 9 mg/dL (ref 8.9–10.3)
Calcium: 8.4 mg/dL — ABNORMAL LOW (ref 8.9–10.3)
Chloride: 99 mmol/L — ABNORMAL LOW (ref 101–111)
Chloride: 102 mmol/L (ref 101–111)
Creatinine, Ser: 5.94 mg/dL — ABNORMAL HIGH (ref 0.61–1.24)
Creatinine, Ser: 6.16 mg/dL — ABNORMAL HIGH (ref 0.61–1.24)
GFR calc non Af Amer: 10 mL/min — ABNORMAL LOW (ref >60)
GFR calc non Af Amer: 9 mL/min — ABNORMAL LOW (ref >60)
GFR, EST AFRICAN AMERICAN: 11 mL/min — AB (ref >60)
GFR, EST AFRICAN AMERICAN: 11 mL/min — AB (ref >60)
GLUCOSE: 120 mg/dL — AB (ref 65–99)
Glucose, Bld: 168 mg/dL — ABNORMAL HIGH (ref 65–99)
POTASSIUM: 6.5 mmol/L — AB (ref 3.5–5.1)
POTASSIUM: 6 mmol/L — AB (ref 3.5–5.1)
Sodium: 128 mmol/L — ABNORMAL LOW (ref 135–145)
Sodium: 129 mmol/L — ABNORMAL LOW (ref 135–145)

## 2015-06-03 LAB — CBC WITH DIFFERENTIAL/PLATELET
Basophils Absolute: 0 10*3/uL (ref 0.0–0.1)
Basophils Relative: 0 % (ref 0–1)
EOS ABS: 0 10*3/uL (ref 0.0–0.7)
EOS PCT: 0 % (ref 0–5)
HCT: 39.9 % (ref 39.0–52.0)
Hemoglobin: 12.5 g/dL — ABNORMAL LOW (ref 13.0–17.0)
Lymphocytes Relative: 6 % — ABNORMAL LOW (ref 12–46)
Lymphs Abs: 1.3 10*3/uL (ref 0.7–4.0)
MCH: 21 pg — AB (ref 26.0–34.0)
MCHC: 31.3 g/dL (ref 30.0–36.0)
MCV: 67.2 fL — ABNORMAL LOW (ref 78.0–100.0)
MONO ABS: 2.5 10*3/uL — AB (ref 0.1–1.0)
Monocytes Relative: 12 % (ref 3–12)
Neutro Abs: 17.2 10*3/uL — ABNORMAL HIGH (ref 1.7–7.7)
Neutrophils Relative %: 82 % — ABNORMAL HIGH (ref 43–77)
PLATELETS: 178 10*3/uL (ref 150–400)
RBC: 5.94 MIL/uL — AB (ref 4.22–5.81)
RDW: 18.3 % — ABNORMAL HIGH (ref 11.5–15.5)
WBC: 21 10*3/uL — ABNORMAL HIGH (ref 4.0–10.5)

## 2015-06-03 LAB — URINALYSIS, ROUTINE W REFLEX MICROSCOPIC
BILIRUBIN URINE: NEGATIVE
Glucose, UA: NEGATIVE mg/dL
Hgb urine dipstick: NEGATIVE
KETONES UR: NEGATIVE mg/dL
Leukocytes, UA: NEGATIVE
NITRITE: NEGATIVE
PROTEIN: NEGATIVE mg/dL
Specific Gravity, Urine: 1.04 — ABNORMAL HIGH (ref 1.005–1.030)
Urobilinogen, UA: 0.2 mg/dL (ref 0.0–1.0)
pH: 5 (ref 5.0–8.0)

## 2015-06-03 LAB — POCT ACTIVATED CLOTTING TIME
ACTIVATED CLOTTING TIME: 220 s
Activated Clotting Time: 503 seconds
Activated Clotting Time: 214 seconds
Activated Clotting Time: 153 seconds

## 2015-06-03 LAB — I-STAT TROPONIN, ED: Troponin i, poc: 31.6 ng/mL (ref 0.00–0.08)

## 2015-06-03 LAB — MRSA PCR SCREENING: MRSA by PCR: NEGATIVE

## 2015-06-03 LAB — GLUCOSE, CAPILLARY
GLUCOSE-CAPILLARY: 111 mg/dL — AB (ref 65–99)
Glucose-Capillary: 146 mg/dL — ABNORMAL HIGH (ref 65–99)

## 2015-06-03 LAB — CBG MONITORING, ED: GLUCOSE-CAPILLARY: 173 mg/dL — AB (ref 65–99)

## 2015-06-03 LAB — TROPONIN I

## 2015-06-03 SURGERY — LEFT HEART CATH AND CORONARY ANGIOGRAPHY
Anesthesia: LOCAL

## 2015-06-03 MED ORDER — ONDANSETRON HCL 4 MG/2ML IJ SOLN: 4.0000 mg | Freq: Four times a day (QID) | INTRAMUSCULAR | Status: DC | PRN

## 2015-06-03 MED ORDER — BIVALIRUDIN 250 MG IV SOLR
INTRAVENOUS | Status: AC
  Filled 2015-06-03: qty 250

## 2015-06-03 MED ORDER — METOPROLOL TARTRATE 1 MG/ML IV SOLN
INTRAVENOUS | Status: DC | PRN
  Administered 2015-06-03: 2.5 mg via INTRAVENOUS

## 2015-06-03 MED ORDER — MIDAZOLAM HCL 2 MG/2ML IJ SOLN
INTRAMUSCULAR | Status: DC | PRN
  Administered 2015-06-03: 1 mg via INTRAVENOUS
  Administered 2015-06-03: 2 mg via INTRAVENOUS
  Administered 2015-06-03: 1 mg via INTRAVENOUS

## 2015-06-03 MED ORDER — NITROGLYCERIN IN D5W 200-5 MCG/ML-% IV SOLN
0.0000 ug/min | INTRAVENOUS | Status: DC
  Administered 2015-06-03: 5 ug/min via INTRAVENOUS

## 2015-06-03 MED ORDER — FENTANYL CITRATE (PF) 100 MCG/2ML IJ SOLN
INTRAMUSCULAR | Status: AC
  Filled 2015-06-03: qty 2

## 2015-06-03 MED ORDER — SODIUM CHLORIDE 0.9 % IV SOLN: 0.2500 mg/kg/h | INTRAVENOUS | Status: DC

## 2015-06-03 MED ORDER — MORPHINE SULFATE 2 MG/ML IJ SOLN
2.0000 mg | INTRAMUSCULAR | Status: DC | PRN
  Administered 2015-06-04: 2 mg via INTRAVENOUS
  Filled 2015-06-03: qty 1

## 2015-06-03 MED ORDER — NITROGLYCERIN IN D5W 200-5 MCG/ML-% IV SOLN
INTRAVENOUS | Status: AC
  Filled 2015-06-03: qty 250

## 2015-06-03 MED ORDER — ASPIRIN 81 MG PO CHEW
324.0000 mg | CHEWABLE_TABLET | Freq: Once | ORAL | Status: AC
  Administered 2015-06-03: 324 mg via ORAL
  Filled 2015-06-03: qty 4

## 2015-06-03 MED ORDER — ASPIRIN EC 81 MG PO TBEC
81.0000 mg | DELAYED_RELEASE_TABLET | Freq: Every day | ORAL | Status: DC
  Administered 2015-06-04 – 2015-06-11 (×8): 81 mg via ORAL
  Filled 2015-06-03 (×10): qty 1

## 2015-06-03 MED ORDER — PROCHLORPERAZINE EDISYLATE 5 MG/ML IJ SOLN: 25.0000 mg | Freq: Once | INTRAMUSCULAR | Status: DC

## 2015-06-03 MED ORDER — SODIUM CHLORIDE 0.9 % IV BOLUS (SEPSIS)
1000.0000 mL | Freq: Once | INTRAVENOUS | Status: AC
  Administered 2015-06-03: 1000 mL via INTRAVENOUS

## 2015-06-03 MED ORDER — SODIUM CHLORIDE 0.9 % IV SOLN: 250.0000 mL | INTRAVENOUS | Status: DC | PRN

## 2015-06-03 MED ORDER — MIDAZOLAM HCL 2 MG/2ML IJ SOLN
INTRAMUSCULAR | Status: AC
  Filled 2015-06-03: qty 2

## 2015-06-03 MED ORDER — ATROPINE SULFATE 0.1 MG/ML IJ SOLN
INTRAMUSCULAR | Status: AC
  Filled 2015-06-03: qty 10

## 2015-06-03 MED ORDER — SODIUM BICARBONATE 8.4 % IV SOLN
INTRAVENOUS | Status: AC
  Filled 2015-06-03: qty 50

## 2015-06-03 MED ORDER — FENTANYL CITRATE (PF) 100 MCG/2ML IJ SOLN
INTRAMUSCULAR | Status: DC | PRN
  Administered 2015-06-03: 50 ug via INTRAVENOUS
  Administered 2015-06-03 (×2): 25 ug via INTRAVENOUS

## 2015-06-03 MED ORDER — SODIUM CHLORIDE 0.9 % IV SOLN
INTRAVENOUS | Status: DC | PRN
  Administered 2015-06-03: 126 mL via INTRAVENOUS

## 2015-06-03 MED ORDER — SODIUM BICARBONATE 8.4 % IV SOLN
INTRAVENOUS | Status: DC
  Administered 2015-06-03: 17:00:00 via INTRAVENOUS
  Filled 2015-06-03 (×2): qty 850

## 2015-06-03 MED ORDER — SODIUM CHLORIDE 0.9 % IJ SOLN: 3.0000 mL | INTRAMUSCULAR | Status: DC | PRN

## 2015-06-03 MED ORDER — SODIUM CHLORIDE 0.9 % IV SOLN
25.0000 mg | Freq: Once | INTRAVENOUS | Status: AC
  Administered 2015-06-04: 25 mg via INTRAVENOUS
  Filled 2015-06-03: qty 1

## 2015-06-03 MED ORDER — SODIUM CHLORIDE 0.9 % IJ SOLN
3.0000 mL | Freq: Two times a day (BID) | INTRAMUSCULAR | Status: DC
Start: 2015-06-03 — End: 2015-06-06
  Administered 2015-06-03 – 2015-06-06 (×5): 3 mL via INTRAVENOUS

## 2015-06-03 MED ORDER — SODIUM CHLORIDE 0.9 % IV SOLN
0.2500 mg/kg/h | INTRAVENOUS | Status: AC
  Filled 2015-06-03: qty 250

## 2015-06-03 MED ORDER — SODIUM CHLORIDE 0.9 % IV SOLN
INTRAVENOUS | Status: DC
  Administered 2015-06-03: 13:00:00 via INTRAVENOUS

## 2015-06-03 MED ORDER — IOHEXOL 350 MG/ML SOLN
INTRAVENOUS | Status: DC | PRN
Start: 2015-06-03 — End: 2015-06-03
  Administered 2015-06-03: 375 mL via INTRA_ARTERIAL

## 2015-06-03 MED ORDER — TICAGRELOR 90 MG PO TABS
ORAL_TABLET | ORAL | Status: DC | PRN
  Administered 2015-06-03: 180 mg via ORAL

## 2015-06-03 MED ORDER — HEART ATTACK BOUNCING BOOK
Freq: Once | Status: AC
  Administered 2015-06-03: 16:00:00
  Filled 2015-06-03: qty 1

## 2015-06-03 MED ORDER — SODIUM BICARBONATE 8.4 % IV SOLN
50.0000 meq | Freq: Once | INTRAVENOUS | Status: AC
  Administered 2015-06-03: 50 meq via INTRAVENOUS
  Filled 2015-06-03: qty 50

## 2015-06-03 MED ORDER — LIDOCAINE HCL (PF) 1 % IJ SOLN
INTRAMUSCULAR | Status: AC
  Filled 2015-06-03: qty 30

## 2015-06-03 MED ORDER — NITROGLYCERIN 1 MG/10 ML FOR IR/CATH LAB
INTRA_ARTERIAL | Status: AC
  Filled 2015-06-03: qty 10

## 2015-06-03 MED ORDER — SODIUM POLYSTYRENE SULFONATE 15 GM/60ML PO SUSP
30.0000 g | Freq: Once | ORAL | Status: AC
  Administered 2015-06-03: 30 g via ORAL
  Filled 2015-06-03 (×2): qty 120

## 2015-06-03 MED ORDER — SODIUM CHLORIDE 0.9 % IV SOLN
250.0000 mg | INTRAVENOUS | Status: DC | PRN
  Administered 2015-06-03: 0.25 mg/kg/h via INTRAVENOUS

## 2015-06-03 MED ORDER — TICAGRELOR 90 MG PO TABS
90.0000 mg | ORAL_TABLET | Freq: Two times a day (BID) | ORAL | Status: DC
  Administered 2015-06-03 – 2015-06-11 (×17): 90 mg via ORAL
  Filled 2015-06-03 (×19): qty 1

## 2015-06-03 MED ORDER — BIVALIRUDIN 250 MG IV SOLR
0.2500 mg/kg/h | INTRAVENOUS | Status: AC
  Administered 2015-06-03: 0.25 mg/kg/h via INTRAVENOUS
  Filled 2015-06-03: qty 250

## 2015-06-03 MED ORDER — METOPROLOL TARTRATE 1 MG/ML IV SOLN
INTRAVENOUS | Status: AC
Start: 2015-06-03 — End: 2015-06-03
  Filled 2015-06-03: qty 5

## 2015-06-03 MED ORDER — ACETAMINOPHEN 325 MG PO TABS: 650.0000 mg | ORAL_TABLET | ORAL | Status: DC | PRN

## 2015-06-03 MED ORDER — ATORVASTATIN CALCIUM 80 MG PO TABS
80.0000 mg | ORAL_TABLET | Freq: Every day | ORAL | Status: DC
  Administered 2015-06-03 – 2015-06-11 (×9): 80 mg via ORAL
  Filled 2015-06-03 (×10): qty 1

## 2015-06-03 MED ORDER — NITROGLYCERIN 1 MG/10 ML FOR IR/CATH LAB
INTRA_ARTERIAL | Status: DC | PRN
  Administered 2015-06-03: 100 ug via INTRACORONARY
  Administered 2015-06-03: 200 ug via INTRACORONARY
  Administered 2015-06-03: 100 ug via INTRACORONARY

## 2015-06-03 MED ORDER — NITROGLYCERIN IN D5W 200-5 MCG/ML-% IV SOLN
INTRAVENOUS | Status: DC | PRN
  Administered 2015-06-03: 5 ug/min via INTRAVENOUS

## 2015-06-03 MED ORDER — SODIUM POLYSTYRENE SULFONATE 15 GM/60ML PO SUSP
60.0000 g | Freq: Once | ORAL | Status: AC
  Administered 2015-06-03: 60 g via ORAL
  Filled 2015-06-03: qty 240

## 2015-06-03 MED ORDER — HEPARIN (PORCINE) IN NACL 2-0.9 UNIT/ML-% IJ SOLN
INTRAMUSCULAR | Status: AC
Start: 2015-06-03 — End: 2015-06-03
  Filled 2015-06-03: qty 1000

## 2015-06-03 MED ORDER — BIVALIRUDIN BOLUS VIA INFUSION - CUPID
INTRAVENOUS | Status: DC | PRN
  Administered 2015-06-03: 94.5 mg via INTRAVENOUS

## 2015-06-03 SURGICAL SUPPLY — 26 items
BALLN EMERGE MR 2.0X12 (BALLOONS) ×3
BALLN EMERGE MR 2.5X20 (BALLOONS) ×3
BALLN ~~LOC~~ TREK RX 3.5X20 (BALLOONS) ×3
BALLOON EMERGE MR 2.0X12 (BALLOONS) IMPLANT
BALLOON EMERGE MR 2.5X20 (BALLOONS) IMPLANT
BALLOON ~~LOC~~ TREK RX 3.5X20 (BALLOONS) IMPLANT
CATH EXTRAC PRONTO LP 6F RND (CATHETERS) ×2 IMPLANT
CATH INFINITI 5FR MULTPACK ANG (CATHETERS) ×2 IMPLANT
CATH VISTA GUIDE 6FR XBLAD3.5 (CATHETERS) ×2 IMPLANT
CATH VISTA GUIDE 6FR XBRCA (CATHETERS) ×2 IMPLANT
GUIDE CATH RUNWAY 6FR FR4 (CATHETERS) ×2 IMPLANT
HOVERMATT SINGLE USE (MISCELLANEOUS) ×2 IMPLANT
KIT ENCORE 26 ADVANTAGE (KITS) ×2 IMPLANT
KIT HEART LEFT (KITS) ×3 IMPLANT
PACK CARDIAC CATHETERIZATION (CUSTOM PROCEDURE TRAY) ×3 IMPLANT
SHEATH PINNACLE 6F 10CM (SHEATH) ×2 IMPLANT
STENT XIENCE ALPINE RX 3.0X38 (Permanent Stent) ×2 IMPLANT
SYR MEDRAD MARK V 150ML (SYRINGE) ×3 IMPLANT
TRANSDUCER W/STOPCOCK (MISCELLANEOUS) ×3 IMPLANT
TUBING CIL FLEX 10 FLL-RA (TUBING) ×3 IMPLANT
WIRE ASAHI FIELDER FC 180CM (WIRE) ×2 IMPLANT
WIRE ASAHI FIELDER XT 190CM (WIRE) ×2 IMPLANT
WIRE ASAHI MEDIUM 180CM (WIRE) ×4 IMPLANT
WIRE ASAHI PROWATER 180CM (WIRE) ×2 IMPLANT
WIRE EMERALD 3MM-J .035X150CM (WIRE) ×2 IMPLANT
WIRE LUGE 182CM (WIRE) ×2 IMPLANT

## 2015-06-03 NOTE — Care Management Note (Addendum)
Case Management Note  Patient Details  Name: Joe Love MRN: [id number] Date of Birth: 1961/10/13  Subjective/Objective:    Adm w mi                Action/Plan: lives at home   Expected Discharge Date:                  Expected Discharge Plan:     In-House Referral:     Discharge planning Services     Post Acute Care Choice:    Choice offered to:     DME Arranged:    DME Agency:     HH Arranged:    HH Agency:     Status of Service:     Medicare Important Message Given:    Date Medicare IM Given:    Medicare IM give by:    Date Additional Medicare IM Given:    Additional Medicare Important Message give by:     If discussed at Long Length of Stay Meetings, dates discussed:    Additional Comments: ur review done . Gave pt 30day free brilinta card. Has pcp but no ins at present. Placed pt assist form for brilinta on shadow chart.  Hanley Haysowell, Chessa Barrasso T, RN 06/05/2015, 1:19 PM

## 2015-06-03 NOTE — ED Notes (Signed)
Josh, PA at the bedside  

## 2015-06-03 NOTE — ED Provider Notes (Signed)
CSN: [id number]     Arrival date & time 06/30/15  7829 History   First MD Initiated Contact with Patient 06-30-2015 0845     Chief Complaint  Patient presents with  . Nausea  . Fatigue     (Consider location/radiation/quality/duration/timing/severity/associated sxs/prior Treatment) HPI Comments: Patient with history of diabetes diagnosed 5 years ago, on metformin daily, HTN, high cholesterol -- presents with complaint of generalized weakness, nausea, fatigue 3 days. No fever, URI symptoms, vomiting, diarrhea. Patient has been compliant with his metformin but states that he missed a dose yesterday because of feeling nauseous. Patient denies abdominal pain, chest pain, shortness of breath. Patient states he has had some "heartburn" over the past 2-3 weeks and a minor episode 2 days ago but none currently. No urinary symptoms including increased frequency. No lower extremity swelling or rash. Patient does not have a history of DKA. No unilateral weakness, trouble walking, confusion, slurred speech or facial droop. No recent falls, trauma. Patient denies risk factors for pulmonary embolism including: unilateral leg swelling, history of DVT/PE/other blood clots, recent immobilizations, recent surgery, recent travel (>4hr segment), malignancy, hemoptysis. No treatments prior to arrival. Onset of symptoms acute. Course is constant. Nothing makes symptoms better or worse.  The history is provided by the patient.    Past Medical History  Diagnosis Date  . Hypertension   . Hyperlipidemia   . Diabetes mellitus without complication    History reviewed. No pertinent past surgical history. Family History  Problem Relation Age of Onset  . Kidney disease Mother   . Hypertension Mother   . Diabetes Brother    History  Substance Use Topics  . Smoking status: Never Smoker   . Smokeless tobacco: Not on file  . Alcohol Use: Yes     Comment: occasionally    Review of Systems  Constitutional: Positive  for fatigue. Negative for fever.  HENT: Negative for rhinorrhea and sore throat.   Eyes: Negative for redness.  Respiratory: Negative for cough, shortness of breath and wheezing.   Cardiovascular: Negative for chest pain.  Gastrointestinal: Positive for nausea. Negative for vomiting, abdominal pain and diarrhea.  Endocrine: Negative for polydipsia and polyuria.  Genitourinary: Negative for dysuria.  Musculoskeletal: Negative for myalgias.  Skin: Negative for rash.  Neurological: Positive for weakness (nonfocal, generalized). Negative for headaches.    Allergies  Shrimp  Home Medications   Prior to Admission medications   Medication Sig Start Date End Date Taking? Authorizing Provider  cloNIDine (CATAPRES) 0.2 MG tablet Take 1 tablet (0.2 mg total) by mouth 2 (two) times daily. 06/30/14   Elvina Sidle, MD  metFORMIN (GLUCOPHAGE) 1000 MG tablet Take 1 tablet (1,000 mg total) by mouth daily. 06/30/14   Elvina Sidle, MD  pravastatin (PRAVACHOL) 20 MG tablet 1 qhs 06/30/14   Elvina Sidle, MD  triamterene-hydrochlorothiazide (MAXZIDE-25) 37.5-25 MG per tablet Take 1 tablet by mouth daily. 01/31/15   Elvina Sidle, MD   BP 129/96 mmHg  Pulse 120  Temp(Src) 97.8 F (36.6 C) (Oral)  Resp 20  Ht 5\' 11"  (1.803 m)  SpO2 100%   Physical Exam  Constitutional: He appears well-developed and well-nourished.  HENT:  Head: Normocephalic and atraumatic.  Mouth/Throat: Mucous membranes are normal. Mucous membranes are not dry.  Eyes: Conjunctivae are normal. Right eye exhibits no discharge. Left eye exhibits no discharge.  Neck: Trachea normal and normal range of motion. Neck supple. Normal carotid pulses and no JVD present. No muscular tenderness present. Carotid bruit is not present.  No tracheal deviation present.  Cardiovascular: Normal rate, regular rhythm, S1 normal, S2 normal, normal heart sounds and intact distal pulses.  Exam reveals no distant heart sounds and no decreased pulses.    No murmur heard. Pulmonary/Chest: Effort normal and breath sounds normal. No respiratory distress. He has no wheezes. He exhibits no tenderness.  Abdominal: Soft. Normal aorta and bowel sounds are normal. There is no tenderness. There is no rebound and no guarding.  Musculoskeletal: He exhibits no edema.  Neurological: He is alert.  Skin: Skin is warm and dry. He is not diaphoretic. No cyanosis. No pallor.  Psychiatric: He has a normal mood and affect.  Nursing note and vitals reviewed.   ED Course  Procedures (including critical care time) Labs Review Labs Reviewed  CBC WITH DIFFERENTIAL/PLATELET - Abnormal; Notable for the following:    WBC 21.0 (*)    RBC 5.94 (*)    Hemoglobin 12.5 (*)    MCV 67.2 (*)    MCH 21.0 (*)    RDW 18.3 (*)    All other components within normal limits  BASIC METABOLIC PANEL - Abnormal; Notable for the following:    Sodium 128 (*)    Potassium 6.5 (*)    Chloride 99 (*)    CO2 18 (*)    Glucose, Bld 168 (*)    BUN 63 (*)    Creatinine, Ser 5.94 (*)    GFR calc non Af Amer 10 (*)    GFR calc Af Amer 11 (*)    All other components within normal limits  CBG MONITORING, ED - Abnormal; Notable for the following:    Glucose-Capillary 173 (*)    All other components within normal limits  I-STAT TROPOININ, ED - Abnormal; Notable for the following:    Troponin i, poc 31.60 (*)    All other components within normal limits  URINALYSIS, ROUTINE W REFLEX MICROSCOPIC (NOT AT [facility])    Imaging Review Dg Chest Port 1 View  05/21/2015   CLINICAL DATA:  Chest pain  EXAM: PORTABLE CHEST - 1 VIEW  COMPARISON:  None.  FINDINGS: Cardiac shadow is enlarged. The lungs are clear bilaterally. No acute bony abnormality is seen.  IMPRESSION: No acute abnormality noted.   Electronically Signed   By: Alcide CleverMark  Lukens M.D.   On: 05/13/2015 09:30   ED ECG REPORT   Date: 05/22/2015  Rate: 119  Rhythm: normal sinus rhythm  QRS Axis: normal  Intervals: normal  ST/T Wave  abnormalities: nonspecific T wave changes, ST elevations inferiorly, ST depressions anteriorly and acute myocardial infarction  Conduction Disutrbances:none  Narrative Interpretation:   Old EKG Reviewed: none available  I have personally reviewed the EKG tracing and agree with the computerized printout as noted.  8:57 AM Patient seen and examined. Work-up initiated. EKG reviewed with Dr. Radford PaxBeaton who will speak with on-call STEMI MD. No old EKG. Patient is currently without chest pain, SOB, other anginal equivalent.   Vital signs reviewed and are as follows: BP 129/96 mmHg  Pulse 120  Temp(Src) 97.8 F (36.6 C) (Oral)  Resp 20  Ht 5\' 11"  (1.803 m)  SpO2 100%  9:20 AM Troponin elevated. STEMI called. ASA given. Heparin ordered.   9:30 AM Pt to cath lab.   CRITICAL CARE Performed by: Carolee RotaGEIPLE,Ruby Logiudice S Total critical care time: 35 Critical care time was exclusive of separately billable procedures and treating other patients. Critical care was necessary to treat or prevent imminent or life-threatening deterioration. Critical care was time  spent personally by me on the following activities: development of treatment plan with patient and/or surrogate as well as nursing, discussions with consultants, evaluation of patient's response to treatment, examination of patient, obtaining history from patient or surrogate, ordering and performing treatments and interventions, ordering and review of laboratory studies, ordering and review of radiographic studies, pulse oximetry and re-evaluation of patient's condition.    MDM   Final diagnoses:  Cough  ST elevation myocardial infarction (STEMI), unspecified artery   Admitted for emergent cath.   Labs that returned after patient departed department show hyperkalemia, AKI, hyponatremia.     Renne Crigler, PA-C 05/21/2015 1001  Nelva Nay, MD 06/06/15 [phone]

## 2015-06-03 NOTE — H&P (Signed)
Patient ID: Joe Love MRN: [id number], DOB/AGE: Nov 30, 1961   Admit date: 05/31/2015   Primary Physician: Elvina SidleLAUENSTEIN,KURT, MD Primary Cardiologist: New (Dr. Tresa EndoKelly)  Pt. Profile:  54 y/o male with h/o DM, HTN and HLD, but no prior cardiac history admitted for acute STEMI  Problem List  Past Medical History  Diagnosis Date  . Hypertension   . Hyperlipidemia   . Diabetes mellitus without complication     History reviewed. No pertinent past surgical history.   Allergies  Allergies  Allergen Reactions  . Shrimp [Shellfish Allergy] Shortness Of Breath    HPI  The patient is a 54 y/o AA male with no prior cardiac history. His PMH is notable for DM, HTN and HLD. No prior h/o tobacco abuse. Family history is notable for CAD (brother with MI in his 8750s, mother had h/o PCI and a pacemaker).   [facility]2015 with complaints of recent h/o indigestion, malaise and fatigue. He first noted chest discomfort, which he describes as "indigestion", 3 days ago while attending a wedding. He had intermittent chest discomfort throughout the day and also developed malaise, fatigue and nausea. However, his indigestion resolved. Today, he redeveloped symptoms of malaise and nausea and felt poorly, prompting him to come to the ED.   On arrival, his BMP was markedly abnormal with Scr at 5.94 and K at 6.5. He has no prior h/o CKD. His last set of labs prior to today was 05/19/2012 and renal function was normal with SCr at 1.03. STAT EKG showed inferior ST elevations. Code STEMI was activated and he was transported urgently to the [facility] cath lab for urgent LHC/ intervention.    Home Medications  Prior to Admission medications   Medication Sig Start Date End Date Taking? Authorizing Provider  cloNIDine (CATAPRES) 0.2 MG tablet Take 1 tablet (0.2 mg total) by mouth 2 (two) times daily. 06/30/14   Elvina SidleKurt Lauenstein, MD  metFORMIN (GLUCOPHAGE) 1000 MG tablet Take 1 tablet (1,000 mg total) by  mouth daily. 06/30/14   Elvina SidleKurt Lauenstein, MD  pravastatin (PRAVACHOL) 20 MG tablet 1 qhs 06/30/14   Elvina SidleKurt Lauenstein, MD  triamterene-hydrochlorothiazide (MAXZIDE-25) 37.5-25 MG per tablet Take 1 tablet by mouth daily. 01/31/15   Elvina SidleKurt Lauenstein, MD    Family History  Family History  Problem Relation Age of Onset  . Kidney disease Mother   . Hypertension Mother   . Diabetes Brother   . Coronary artery disease Mother   . Coronary artery disease Brother 755    Social History  History   Social History  . Marital Status: Divorced    Spouse Name: N/A  . Number of Children: N/A  . Years of Education: N/A   Occupational History  . Not on file.   Social History Main Topics  . Smoking status: Never Smoker   . Smokeless tobacco: Not on file  . Alcohol Use: Yes     Comment: occasionally  . Drug Use: No  . Sexual Activity: Not Currently   Other Topics Concern  . Not on file   Social History Narrative     Review of Systems General:  No chills, fever, night sweats or weight changes.  Cardiovascular:  + for chest pain, no dyspnea on exertion, edema, orthopnea, palpitations, paroxysmal nocturnal dyspnea. Dermatological: No rash, lesions/masses Respiratory: No cough, dyspnea Urologic: No hematuria, dysuria Abdominal:   + nausea,  No vomiting, diarrhea, bright red blood per rectum, melena, or hematemesis Neurologic:  No visual changes,  wkns, changes in mental status. All other systems reviewed and are otherwise negative except as noted above.  Physical Exam  Blood pressure 137/115, pulse 117, temperature 97.7 F (36.5 C), temperature source Oral, resp. rate 31, height 5\' 11"  (1.803 m), weight 262 lb 2 oz (118.9 kg), SpO2 99 %.  General: Pleasant, NAD, moderately obese Psych: Normal affect. Neuro: Alert and oriented X 3. Moves all extremities spontaneously. HEENT: Normal  Neck: Supple without bruits or JVD. Lungs:  Resp regular and unlabored, CTA. Heart: RRR no s3, s4, or  murmurs. Abdomen: Soft, non-tender, non-distended, decreased DPs.  Extremities: No clubbing, cyanosis or edema. DP/PT/Radials 2+ and equal bilaterally.  Labs  Troponin [facility] Test)  Recent Labs  06/05/2015 0903  TROPIPOC 31.60*   No results for input(s): CKTOTAL, CKMB, TROPONINI in the last 72 hours. Lab Results  Component Value Date   WBC 21.0* 06/02/2015   HGB 12.5* 05/11/2015   HCT 39.9 05/13/2015   MCV 67.2* 06/02/2015   PLT 178 06/04/2015     Recent Labs Lab 05/11/2015 0900  NA 128*  K 6.5*  CL 99*  CO2 18*  BUN 63*  CREATININE 5.94*  CALCIUM 9.0  GLUCOSE 168*   Lab Results  Component Value Date   CHOL 253* 05/16/2012   HDL 35* 05/16/2012   LDLCALC 181* 05/16/2012   TRIG 184* 05/16/2012   No results found for: DDIMER   Radiology/Studies  Dg Chest Port 1 View  05/17/2015   CLINICAL DATA:  Chest pain  EXAM: PORTABLE CHEST - 1 VIEW  COMPARISON:  None.  FINDINGS: Cardiac shadow is enlarged. The lungs are clear bilaterally. No acute bony abnormality is seen.  IMPRESSION: No acute abnormality noted.   Electronically Signed   By: Alcide Clever M.D.   On: 05/23/2015 09:30    ECG  Inferior ST elevations, 104 bpm   Echocardiogram - pending  ASSESSMENT AND PLAN  Active Problems:   STEMI (ST elevation myocardial infarction)   Acute renal failure syndrome   ST elevation myocardial infarction involving left circumflex coronary artery   ST elevation myocardial infarction (STEMI) involving right coronary artery with complication    1. Acute Inferior STEMI: Emergent LHC for attempt at percutaneous revascularization.   2. Acute Renal Failure: Scr 5.94 (prior to contrast dye exposure). No prior h/o CKD. Last CMP from 05/2012 demonstrated normal SCr at 1.09. Renal consult placed. Dr. Lowell Guitar to see today.   Signed, Robbie Lis, PA-C 06/02/2015, 2:42 PM   Patient seen and examined. Agree with assessment and plan.  Joe Love is a 54 year old  African-American male who denies any awareness of known cardiac disease.  However, he has a history of hypertension, hyperlipidemia and type 2 diabetes mellitus.  He does not recall ever experiencing chest pain.  This past Saturday he was singing at a wedding and developed indigestion-like symptoms.  His indigestion symptoms persisted.  He was able to make it through the wedding.  He did not feel well over the weekend but did not seek medical attention.  Today he presented to the emergency room more GI complaints.  However, ECG was obtained which showed Q waves inferiorly with significant inferior lateral ST segment elevation as well as precordial ST segment depression.  Initial troponin was in excess of 30.  A code STEMI was called. The patient is now taken acutely to the catheterization laboratory.  Of note, upon arrival at the catheterization suite, his laboratory had come back after the procedure was  started, which revealed acute renal failure with initial creatinine of 5.94 prior to any contrast exposure.  See the full catheterization report.  He was found to have total occlusion of the mid distal LAD, total occlusion of the circumflex involving the OM 2 and distal circumflex and total occlusion of the RCA just beyond the ostium.  RCA flow was successfully restored with a long arduous procedure including PTCA, thrombectomy, DES stenting of the ostium to the mid segment and PTCA of the distal RCA, an acute margin with inability of the stent to traverse the sharp angled acute margin segment distally.  Upon completion of the procedure, he was chest pain-free with sinus tachycardia.  He was brought to the coronary care unit.  Nephrology will be consulted for his kidney disease.  Aggressive medical therapy will be implemented for his concomitant CAD.   Lennette Bihari, MD, [facility] 05/23/2015 7:45 PM

## 2015-06-03 NOTE — ED Notes (Signed)
Paged Stemi Dr. To [phone] 

## 2015-06-03 NOTE — ED Notes (Signed)
Activated Stemi @ 09:16

## 2015-06-03 NOTE — ED Notes (Signed)
Dr Beaton at bedside 

## 2015-06-03 NOTE — Consult Note (Signed)
HPI: I was asked by Dr. Claiborne Billings to see Joe Love who is a 54 y.o. male  with no prior cardiac or renal history. He presented to the [facility] ED today on 05/23/2015 with complaints of recent h/o indigestion, malaise, fatigue and chest discomfort, which he described as "indigestion", 3 days PTA while attending a wedding. He had intermittent chest discomfort throughout the day and also developed malaise, fatigue and nausea.  Today, he redeveloped symptoms of malaise and nausea and felt poorly, prompting him to come to the ED. EKG showed inferior ST elevations. Code STEMI was activated and he was transported urgently to the [facility] cath lab for urgent intervention. BMP was markedly abnormal with Scr at 5.94 and K at 6.5. He has no prior h/o CKD per his recollection. His last set of labs prior to today was 05/19/2012 with SCr at 1.03.  He is followed at [facility], last seen on June 30, 2014. Creat on  He denies edema of LEs.  UA revealed no protein.  He does c/o occ numbness in feet.     Past Medical History  Diagnosis Date  . Hypertension   . Hyperlipidemia   . Diabetes mellitus without complication    History reviewed. No pertinent past surgical history. Social History:  reports that he has never smoked. He does not have any smokeless tobacco history on file. He reports that he drinks alcohol. He reports that he does not use illicit drugs. Allergies:  Allergies  Allergen Reactions  . Shrimp [Shellfish Allergy] Shortness Of Breath   Family History  Problem Relation Age of Onset  . Kidney disease Mother   . Hypertension Mother   . Diabetes Brother   . Coronary artery disease Mother   . Coronary artery disease Brother 56    Medications:  Prior to Admission:  Prescriptions prior to admission  Medication Sig Dispense Refill Last Dose  . cloNIDine (CATAPRES) 0.2 MG tablet Take 1 tablet (0.2 mg total) by mouth 2 (two) times daily. 60 tablet 11   . metFORMIN (GLUCOPHAGE) 1000 MG tablet Take 1 tablet  (1,000 mg total) by mouth daily. 30 tablet 11   . pravastatin (PRAVACHOL) 20 MG tablet 1 qhs 30 tablet 11   . triamterene-hydrochlorothiazide (MAXZIDE-25) 37.5-25 MG per tablet Take 1 tablet by mouth daily. 30 tablet 11    Scheduled: . [START ON 06/04/2015] aspirin EC  81 mg Oral Daily  . atorvastatin  80 mg Oral q1800  . [COMPLETED] heart attack bouncing book   Does not apply Once  . sodium chloride  3 mL Intravenous Q12H  . sodium polystyrene  30 g Oral Once  . sodium polystyrene  60 g Oral Once  . ticagrelor  90 mg Oral BID   ROS: as per HPI  Blood pressure 137/115, pulse 117, temperature 97.7 F (36.5 C), temperature source Oral, resp. rate 31, height $RemoveBe'5\' 11"'[id number]$  (1.803 m), weight 118.9 kg (262 lb 2 oz), SpO2 99 %.  General appearance: alert, cooperative and appears stated age Head: Normocephalic, without obvious abnormality, atraumatic Eyes: EOMI Ears: normal TM's and external ear canals both ears Nose: Nares normal. Septum midline. Mucosa normal. No drainage or sinus tenderness. Throat: lips, mucosa, and tongue normal; teeth and gums normal Resp: clear to auscultation bilaterally Chest wall: no tenderness Cardio: regular rate and rhythm, S1, S2 normal, no murmur, click, rub or gallop GI: soft, non-tender; bowel sounds normal; no masses,  no organomegaly Extremities: extremities normal, atraumatic, no cyanosis or edema Skin: Skin  color, texture, turgor normal. No rashes or lesions Neurologic: Grossly normal Results for orders placed or performed during the hospital encounter of 05/23/2015 (from the past 48 hour(s))  CBC with Differential/Platelet     Status: Abnormal   Collection Time: 05/08/2015  9:00 AM  Result Value Ref Range   WBC 21.0 (H) 4.0 - 10.5 K/uL   RBC 5.94 (H) 4.22 - 5.81 MIL/uL   Hemoglobin 12.5 (L) 13.0 - 17.0 g/dL   HCT 39.9 39.0 - 52.0 %   MCV 67.2 (L) 78.0 - 100.0 fL   MCH 21.0 (L) 26.0 - 34.0 pg   MCHC 31.3 30.0 - 36.0 g/dL   RDW 18.3 (H) 11.5 - 15.5 %    Platelets 178 150 - 400 K/uL   Neutrophils Relative % 82 (H) 43 - 77 %   Lymphocytes Relative 6 (L) 12 - 46 %   Monocytes Relative 12 3 - 12 %   Eosinophils Relative 0 0 - 5 %   Basophils Relative 0 0 - 1 %   Neutro Abs 17.2 (H) 1.7 - 7.7 K/uL   Lymphs Abs 1.3 0.7 - 4.0 K/uL   Monocytes Absolute 2.5 (H) 0.1 - 1.0 K/uL   Eosinophils Absolute 0.0 0.0 - 0.7 K/uL   Basophils Absolute 0.0 0.0 - 0.1 K/uL   RBC Morphology BURR CELLS   Basic metabolic panel     Status: Abnormal   Collection Time: 05/13/2015  9:00 AM  Result Value Ref Range   Sodium 128 (L) 135 - 145 mmol/L   Potassium 6.5 (HH) 3.5 - 5.1 mmol/L    Comment: CRITICAL RESULT CALLED TO, READ BACK BY AND VERIFIED WITH: ROBINSON B RN 05/12/2015 0953 COSTELLO B REPEATED TO VERIFY    Chloride 99 (L) 101 - 111 mmol/L   CO2 18 (L) 22 - 32 mmol/L   Glucose, Bld 168 (H) 65 - 99 mg/dL   BUN 63 (H) 6 - 20 mg/dL   Creatinine, Ser 5.94 (H) 0.61 - 1.24 mg/dL   Calcium 9.0 8.9 - 10.3 mg/dL   GFR calc non Af Amer 10 (L) >60 mL/min   GFR calc Af Amer 11 (L) >60 mL/min    Comment: (NOTE) The eGFR has been calculated using the CKD EPI equation. This calculation has not been validated in all clinical situations. eGFR's persistently <60 mL/min signify possible Chronic Kidney Disease.    Anion gap 11 5 - 15  CBG monitoring, ED     Status: Abnormal   Collection Time: 05/22/2015  9:01 AM  Result Value Ref Range   Glucose-Capillary 173 (H) 65 - 99 mg/dL  I-stat troponin, ED     Status: Abnormal   Collection Time: 05/23/2015  9:03 AM  Result Value Ref Range   Troponin i, poc 31.60 (HH) 0.00 - 0.08 ng/mL   Comment NOTIFIED PHYSICIAN    Comment 3            Comment: Due to the release kinetics of cTnI, a negative result within the first hours of the onset of symptoms does not rule out myocardial infarction with certainty. If myocardial infarction is still suspected, repeat the test at appropriate intervals.   MRSA PCR Screening     Status: None    Collection Time: 05/15/2015  1:03 PM  Result Value Ref Range   MRSA by PCR NEGATIVE NEGATIVE    Comment:        The GeneXpert MRSA Assay (FDA approved for NASAL specimens only), is one component  of a comprehensive MRSA colonization surveillance program. It is not intended to diagnose MRSA infection nor to guide or monitor treatment for MRSA infections.   Glucose, capillary     Status: Abnormal   Collection Time: 05/13/2015  1:15 PM  Result Value Ref Range   Glucose-Capillary 146 (H) 65 - 99 mg/dL  Urinalysis, Routine w reflex microscopic (not at [facility])     Status: Abnormal   Collection Time: 06/02/2015  1:54 PM  Result Value Ref Range   Color, Urine YELLOW YELLOW   APPearance CLEAR CLEAR   Specific Gravity, Urine 1.040 (H) 1.005 - 1.030   pH 5.0 5.0 - 8.0   Glucose, UA NEGATIVE NEGATIVE mg/dL   Hgb urine dipstick NEGATIVE NEGATIVE   Bilirubin Urine NEGATIVE NEGATIVE   Ketones, ur NEGATIVE NEGATIVE mg/dL   Protein, ur NEGATIVE NEGATIVE mg/dL   Urobilinogen, UA 0.2 0.0 - 1.0 mg/dL   Nitrite NEGATIVE NEGATIVE   Leukocytes, UA NEGATIVE NEGATIVE    Comment: MICROSCOPIC NOT DONE ON URINES WITH NEGATIVE PROTEIN, BLOOD, LEUKOCYTES, NITRITE, OR GLUCOSE <1000 mg/dL.   Dg Chest Port 1 View  05/25/2015   CLINICAL DATA:  Chest pain  EXAM: PORTABLE CHEST - 1 VIEW  COMPARISON:  None.  FINDINGS: Cardiac shadow is enlarged. The lungs are clear bilaterally. No acute bony abnormality is seen.  IMPRESSION: No acute abnormality noted.   Electronically Signed   By: Inez Catalina M.D.   On: 06/02/2015 09:30    Assessment:  1 Renal Failure, acute v chronic 2 Hyperkalemia 3 S/P IWMI s/p intervention 4 Diabetes mellitus 5 Hypertension, poorly controlled Plan: 1 kayexalate po 2 bladder scan 3 renal ultrasound 4 BP control per cardiology  Nerida Boivin C 06/04/2015, 3:24 PM

## 2015-06-03 NOTE — ED Notes (Signed)
Pt states he is diabetic and since Saturday his feet has been feeling funny, he has been nauseated, and felt tired. No vomiting but reports dry heaving.

## 2015-06-03 NOTE — ED Notes (Signed)
Cath Lab pads placed on pt

## 2015-06-03 NOTE — Telephone Encounter (Signed)
[facility] Low[facility] wanting pt's past creatinine, lab work, office notes. I will pull his chart and fax those to him.

## 2015-06-04 ENCOUNTER — Inpatient Hospital Stay (HOSPITAL_COMMUNITY): Payer: Medicaid Other

## 2015-06-04 ENCOUNTER — Encounter (HOSPITAL_COMMUNITY): Payer: Self-pay | Admitting: Cardiovascular Disease

## 2015-06-04 ENCOUNTER — Other Ambulatory Visit (HOSPITAL_COMMUNITY): Payer: Self-pay

## 2015-06-04 DIAGNOSIS — N189 Chronic kidney disease, unspecified: Secondary | ICD-10-CM

## 2015-06-04 DIAGNOSIS — D631 Anemia in chronic kidney disease: Secondary | ICD-10-CM

## 2015-06-04 DIAGNOSIS — I361 Nonrheumatic tricuspid (valve) insufficiency: Secondary | ICD-10-CM

## 2015-06-04 LAB — BASIC METABOLIC PANEL
Anion gap: 14 (ref 5–15)
BUN: 75 mg/dL — ABNORMAL HIGH (ref 6–20)
CALCIUM: 8.4 mg/dL — AB (ref 8.9–10.3)
CHLORIDE: 99 mmol/L — AB (ref 101–111)
CO2: 18 mmol/L — AB (ref 22–32)
CREATININE: 6.54 mg/dL — AB (ref 0.61–1.24)
GFR calc Af Amer: 10 mL/min — ABNORMAL LOW (ref >60)
GFR calc non Af Amer: 9 mL/min — ABNORMAL LOW (ref >60)
Glucose, Bld: 123 mg/dL — ABNORMAL HIGH (ref 65–99)
POTASSIUM: 5.1 mmol/L (ref 3.5–5.1)
Sodium: 131 mmol/L — ABNORMAL LOW (ref 135–145)

## 2015-06-04 LAB — CBC
HCT: 33.4 % — ABNORMAL LOW (ref 39.0–52.0)
Hemoglobin: 10.4 g/dL — ABNORMAL LOW (ref 13.0–17.0)
MCH: 20.8 pg — AB (ref 26.0–34.0)
MCHC: 31.1 g/dL (ref 30.0–36.0)
MCV: 66.7 fL — ABNORMAL LOW (ref 78.0–100.0)
Platelets: 138 10*3/uL — ABNORMAL LOW (ref 150–400)
RBC: 5.01 MIL/uL (ref 4.22–5.81)
RDW: 18.4 % — ABNORMAL HIGH (ref 11.5–15.5)
WBC: 13.5 10*3/uL — AB (ref 4.0–10.5)

## 2015-06-04 LAB — BRAIN NATRIURETIC PEPTIDE: B Natriuretic Peptide: 679.9 pg/mL — ABNORMAL HIGH (ref 0.0–100.0)

## 2015-06-04 LAB — GLUCOSE, CAPILLARY
Glucose-Capillary: 103 mg/dL — ABNORMAL HIGH (ref 65–99)
Glucose-Capillary: 98 mg/dL (ref 65–99)

## 2015-06-04 LAB — IRON AND TIBC
IRON: 9 ug/dL — AB (ref 45–182)
Saturation Ratios: 3 % — ABNORMAL LOW (ref 17.9–39.5)
TIBC: 354 ug/dL (ref 250–450)
UIBC: 345 ug/dL

## 2015-06-04 MED ORDER — SODIUM BICARBONATE 650 MG PO TABS
1300.0000 mg | ORAL_TABLET | Freq: Two times a day (BID) | ORAL | Status: DC
  Administered 2015-06-04 – 2015-06-07 (×7): 1300 mg via ORAL
  Filled 2015-06-04 (×8): qty 2

## 2015-06-04 MED ORDER — PERFLUTREN LIPID MICROSPHERE
INTRAVENOUS | Status: AC
  Filled 2015-06-04: qty 10

## 2015-06-04 MED ORDER — HEPARIN (PORCINE) IN NACL 100-0.45 UNIT/ML-% IJ SOLN
1600.0000 [IU]/h | INTRAMUSCULAR | Status: DC
  Administered 2015-06-04 – 2015-06-05 (×2): 1600 [IU]/h via INTRAVENOUS
  Filled 2015-06-04 (×4): qty 250

## 2015-06-04 MED ORDER — HEPARIN BOLUS VIA INFUSION
4000.0000 [IU] | Freq: Once | INTRAVENOUS | Status: AC
  Administered 2015-06-04: 4000 [IU] via INTRAVENOUS
  Filled 2015-06-04: qty 4000

## 2015-06-04 MED ORDER — STERILE WATER FOR INJECTION IV SOLN
INTRAVENOUS | Status: DC
  Filled 2015-06-04 (×4): qty 850

## 2015-06-04 MED ORDER — WHITE PETROLATUM GEL
Status: AC
  Filled 2015-06-04: qty 1

## 2015-06-04 MED ORDER — CARVEDILOL 3.125 MG PO TABS
3.1250 mg | ORAL_TABLET | Freq: Once | ORAL | Status: AC
  Administered 2015-06-04: 3.125 mg via ORAL
  Filled 2015-06-04: qty 1

## 2015-06-04 MED ORDER — ALPRAZOLAM 0.5 MG PO TABS
0.5000 mg | ORAL_TABLET | Freq: Every evening | ORAL | Status: DC | PRN
  Administered 2015-06-04 – 2015-06-05 (×3): 0.5 mg via ORAL
  Filled 2015-06-04 (×3): qty 1

## 2015-06-04 MED ORDER — CARVEDILOL 3.125 MG PO TABS
3.1250 mg | ORAL_TABLET | Freq: Two times a day (BID) | ORAL | Status: DC
  Administered 2015-06-04: 3.125 mg via ORAL
  Filled 2015-06-04 (×4): qty 1

## 2015-06-04 MED ORDER — PERFLUTREN LIPID MICROSPHERE
1.0000 mL | INTRAVENOUS | Status: AC | PRN
  Administered 2015-06-04: 4 mL via INTRAVENOUS
  Filled 2015-06-04: qty 10

## 2015-06-04 MED ORDER — METOPROLOL TARTRATE 1 MG/ML IV SOLN
2.5000 mg | Freq: Once | INTRAVENOUS | Status: AC
  Administered 2015-06-04: 2.5 mg via INTRAVENOUS
  Filled 2015-06-04: qty 5

## 2015-06-04 MED FILL — Lidocaine HCl Local Preservative Free (PF) Inj 1%: INTRAMUSCULAR | Qty: 30 | Status: AC

## 2015-06-04 MED FILL — Heparin Sodium (Porcine) 2 Unit/ML in Sodium Chloride 0.9%: INTRAMUSCULAR | Qty: 1000 | Status: AC

## 2015-06-04 NOTE — Progress Notes (Signed)
Arterial sheath was removed at 2340 and pressure held for to achieve hemostasis.There was minimal blood ooze from the site post hemostasis. Clear instructions given to patient . Will continue to monitor

## 2015-06-04 NOTE — Progress Notes (Signed)
Renal replacement video watched by patient per MD order.

## 2015-06-04 NOTE — Progress Notes (Signed)
Subjective:  No recurrent chest pain; Feels better.  Objective:   Vital Signs : Filed Vitals:   06/04/15 0400 06/04/15 0500 06/04/15 0600 06/04/15 0700  BP: 94/64 98/51 112/57 117/43  Pulse: 45 117 123   Temp: 97.9 F (36.6 C)   98.5 F (36.9 C)  TempSrc:    Oral  Resp: 12 0 27 18  Height:      Weight:      SpO2: 99% 91% 95% 94%    Intake/Output from previous day:  Intake/Output Summary (Last 24 hours) at 06/04/15 0838 Last data filed at 06/04/15 0600  Gross per 24 hour  Intake 1617.73 ml  Output    710 ml  Net 907.73 ml    I/O since admission:  +907  Wt Readings from Last 3 Encounters:  05/07/2015 262 lb 2 oz (118.9 kg)  06/30/14 276 lb 12.8 oz (125.556 kg)  04/11/13 274 lb (124.286 kg)    Medications: . aspirin EC  81 mg Oral Daily  . atorvastatin  80 mg Oral q1800  . sodium bicarbonate      . sodium chloride  3 mL Intravenous Q12H  . ticagrelor  90 mg Oral BID  . white petrolatum        . nitroGLYCERIN Stopped (06/04/15 0210)  .  sodium bicarbonate 150 mEq in sterile water 1000 mL infusion 50 mL/hr at 05/31/2015 1719    Physical Exam:   General appearance: alert, cooperative and no distress Neck: no adenopathy, no carotid bruit, supple, symmetrical, trachea midline and thyroid not enlarged, symmetric, no tenderness/mass/nodules; JVD 7-8 cm Lungs: clear to auscultation bilaterally Heart: Tachycardic, regular, 1/6 sem, no diastolic murmur; no rub Abdomen: soft, non-tender; bowel sounds normal; no masses,  no organomegaly Extremities: extremities normal, atraumatic, no cyanosis or edema and no edema, redness or tenderness in the calves or thighs Pulses: 2+ and symmetric Skin: Skin color, texture, turgor normal. No rashes or lesions Neurologic: Grossly normal   Rate: 123  Rhythm: sinus tachycardia  ECG (independently read by me):  ST at 135;  Q 2,3 aVF with residual 1-2 mm ST elevation; T wave inversion V2-3 I aVL      Lab Results:   Recent Labs  05/24/2015 0900 05/22/2015 1003 05/21/2015 2113 06/04/15 0226  NA 128* 117* 129* 131*  K 6.5* 5.6* 6.0* 5.1  CL 99* 90* 102 99*  CO2 18*  --  17* 18*  GLUCOSE 168* 133* 120* 123*  BUN 63* 53* 71* 75*  CREATININE 5.94* 4.30* 6.16* 6.54*  CALCIUM 9.0  --  8.4* 8.4*    Hepatic Function Latest Ref Rng 05/16/2012  Total Protein 6.0 - 8.3 g/dL 7.2  Albumin 3.5 - 5.2 g/dL 4.5  AST 0 - 37 U/L 19  ALT 0 - 53 U/L 24  Alk Phosphatase 39 - 117 U/L 62  Total Bilirubin 0.3 - 1.2 mg/dL 0.6     Recent Labs  05/25/2015 0900 05/10/2015 1003 06/04/15 0226  WBC 21.0*  --  13.5*  NEUTROABS 17.2*  --   --   HGB 12.5* 13.9 10.4*  HCT 39.9 41.0 33.4*  MCV 67.2*  --  66.7*  PLT 178  --  138*     Recent Labs  05/19/2015 1546  TROPONINI >65.00*    No results found for: TSH No results for input(s): HGBA1C in the last 72 hours.  No results for input(s): PROT, ALBUMIN, AST, ALT, ALKPHOS, BILITOT, BILIDIR, IBILI in the last 72 hours. No results for  input(s): INR in the last 72 hours. BNP (last 3 results) No results for input(s): BNP in the last 8760 hours.  ProBNP (last 3 results) No results for input(s): PROBNP in the last 8760 hours.   Lipid Panel     Component Value Date/Time   CHOL 253* 05/16/2012 1750   TRIG 184* 05/16/2012 1750   HDL 35* 05/16/2012 1750   CHOLHDL 7.2 05/16/2012 1750   VLDL 37 05/16/2012 1750   LDLCALC 181* 05/16/2012 1750         Imaging:  Dg Chest Port 1 View  05/26/2015   CLINICAL DATA:  Chest pain  EXAM: PORTABLE CHEST - 1 VIEW  COMPARISON:  None.  FINDINGS: Cardiac shadow is enlarged. The lungs are clear bilaterally. No acute bony abnormality is seen.  IMPRESSION: No acute abnormality noted.   Electronically Signed   By: Inez Catalina M.D.   On: 05/24/2015 09:30      Assessment/Plan:   Active Problems:   STEMI (ST elevation myocardial infarction)   Acute renal failure syndrome   ST elevation myocardial infarction involving left circumflex coronary  artery   ST elevation myocardial infarction (STEMI) involving right coronary artery with complication  1. STEMI: Probably initiated 3 days PTA with RCA; suspect LCX occlusion may be chronic although initially it was uncertain if this was also acute.  S/P PTCA/thrombectomy, DES stenting from proximal to mid RCA with PTCA at acute margin and distal RCA (could not pass stent through sharp angled acute margin).  Troponin>65 2. Severe 3 vessel CAD with also total mid-distal LAD occlusion 3. Sinus Tachycardia: suspect significant ischemic cardiomyopathy; echo not yet done. BP is low, but will try to initiate very low dose carvedilol as BP allows. 4. Probable acute on chronic kidney disease, unknown to patient PTA. Seen by Dr. Florene Glen of renal yesterday. Cr inc to 6.54 today  5. Hyperkalemia: received kayexelate last nigh, total of 60 and 30 grams yesterday. K today 5.1 6. Metabolic acidosis with CO2 17 - 18 7. Hyponatremia 8. Microcytic anemia; prob contributed by renal disease but ? Fe deficiency or thallasemia/sickle trait 9. Hyperlidemia: LDL 181, now on atorvastatin 80 mg 10. Type 2 DM 11. Obesity    Troy Sine, MD, [facility] 06/04/2015, 8:38 AM

## 2015-06-04 NOTE — Progress Notes (Signed)
Assessment:  1 Renal Failure, acute v chronic; exacerbated by hemodynamic insult and contrast 2 Hyperkalemia, resolved 3 S/P IWMI s/p intervention 4 Diabetes mellitus 5 Hypertension, poorly controlled in the past Plan: 1 renal ultrasound 2 BP control per cardiology 3 Renal replacement education 4 PO Bicarb, DC IV 5 ck SPEP  Subjective: Interval History: Feels better  Objective: Vital signs in last 24 hours: Temp:  [97.7 F (36.5 C)-98.6 F (37 C)] 98.5 F (36.9 C) (06/29 0700) Pulse Rate:  [0-142] 62 (06/29 0900) Resp:  [0-42] 29 (06/29 0900) BP: (94-156)/(41-115) 108/65 mmHg (06/29 0900) SpO2:  [0 %-100 %] 98 % (06/29 0900) Arterial Line BP: (128-164)/(83-115) 128/83 mmHg (06/28 2330) Weight:  [118.9 kg (262 lb 2 oz)] 118.9 kg (262 lb 2 oz) (06/28 1300) Weight change:   Intake/Output from previous day: 06/28 0701 - 06/29 0700 In: 1667.7 [P.O.:720; I.V.:947.7] Out: 910 [Urine:910] Intake/Output this shift: Total I/O In: 340 [P.O.:240; I.V.:100] Out: -   General appearance: alert and cooperative Chest wall: no tenderness Cardio: irregularly irregular rhythm, tachycardic GI: soft, non-tender; bowel sounds normal; no masses,  no organomegaly Extremities: extremities normal, atraumatic, no cyanosis or edema  Lab Results:  Recent Labs  05/21/2015 0900 05/09/2015 1003 06/04/15 0226  WBC 21.0*  --  13.5*  HGB 12.5* 13.9 10.4*  HCT 39.9 41.0 33.4*  PLT 178  --  138*   BMET:  Recent Labs  05/19/2015 2113 06/04/15 0226  NA 129* 131*  K 6.0* 5.1  CL 102 99*  CO2 17* 18*  GLUCOSE 120* 123*  BUN 71* 75*  CREATININE 6.16* 6.54*  CALCIUM 8.4* 8.4*   No results for input(s): PTH in the last 72 hours. Iron Studies: No results for input(s): IRON, TIBC, TRANSFERRIN, FERRITIN in the last 72 hours. Studies/Results: Dg Chest Port 1 View  05/26/2015   CLINICAL DATA:  Chest pain  EXAM: PORTABLE CHEST - 1 VIEW  COMPARISON:  None.  FINDINGS: Cardiac shadow is enlarged. The  lungs are clear bilaterally. No acute bony abnormality is seen.  IMPRESSION: No acute abnormality noted.   Electronically Signed   By: Alcide CleverMark  Lukens M.D.   On: 05/21/2015 09:30   Scheduled: . aspirin EC  81 mg Oral Daily  . atorvastatin  80 mg Oral q1800  . carvedilol  3.125 mg Oral BID WC  . sodium bicarbonate      . sodium chloride  3 mL Intravenous Q12H  . ticagrelor  90 mg Oral BID  . white petrolatum         LOS: 1 day   Joe Love C 06/04/2015,10:17 AM

## 2015-06-04 NOTE — Progress Notes (Addendum)
CARDIAC REHAB PHASE I   PRE:  Rate/Rhythm: 114 ST PACs  BP:  Supine:   Sitting: 102/62  Standing:    SaO2: 100%RA  MODE:  Ambulation: 250 ft   POST:  Rate/Rhythm: 130 ST  BP:  Supine:   Sitting: 146/80  Standing:    SaO2: 97%RA 1410-1435 Pt walked 250 ft on RA with asst x 1 with slow steady gait. Very sleepy. Tachy. To recliner with call bell after walk. Will educate when more awake. No c/o CP or dizziness.   Luetta Nuttingharlene Aydon Swamy, RN BSN  06/04/2015 2:31 PM

## 2015-06-04 NOTE — Progress Notes (Addendum)
ANTICOAGULATION CONSULT NOTE - Initial Consult  Pharmacy Consult for Heparin Indication: afib, apical thrombus seen on ECHO  Allergies  Allergen Reactions  . Shrimp [Shellfish Allergy] Shortness Of Breath    Patient Measurements: Height: 5\' 11"  (180.3 cm) Weight: 262 lb 2 oz (118.9 kg) IBW/kg (Calculated) : 75.3 Heparin Dosing Weight: 100 kg  Vital Signs: Temp: 98.7 F (37.1 C) (06/29 1650) Temp Source: Oral (06/29 1650) BP: 97/63 mmHg (06/29 2259) Pulse Rate: 88 (06/29 2259)  Labs:  Recent Labs  05/10/2015 0900 05/21/2015 1003 05/21/2015 1546 05/17/2015 2113 06/04/15 0226  HGB 12.5* 13.9  --   --  10.4*  HCT 39.9 41.0  --   --  33.4*  PLT 178  --   --   --  138*  CREATININE 5.94* 4.30*  --  6.16* 6.54*  TROPONINI  --   --  >65.00*  --   --     Estimated Creatinine Clearance: 16.9 mL/min (by C-G formula based on Cr of 6.54).   Medical History: Past Medical History  Diagnosis Date  . Hypertension   . Hyperlipidemia   . Diabetes mellitus without complication     Medications:  Prescriptions prior to admission  Medication Sig Dispense Refill Last Dose  . cloNIDine (CATAPRES) 0.2 MG tablet Take 1 tablet (0.2 mg total) by mouth 2 (two) times daily. 60 tablet 11 06/02/2015 at Unknown time  . metFORMIN (GLUCOPHAGE) 1000 MG tablet Take 1 tablet (1,000 mg total) by mouth daily. 30 tablet 11 06/02/2015 at Unknown time  . pravastatin (PRAVACHOL) 20 MG tablet 1 qhs (Patient taking differently: Take 20 mg by mouth at bedtime. ) 30 tablet 11 06/02/2015 at Unknown time  . triamterene-hydrochlorothiazide (MAXZIDE-25) 37.5-25 MG per tablet Take 1 tablet by mouth daily. 30 tablet 11 06/01/2015    Assessment: 54 y.o. male presented with STEMI - s/p cath and stent 6/28. To begin heparin for afib and apical thrombus seen on ECHO. Hgb and plt down today - will continue to watch trend. SCr 6.54, est CrCl 15 ml/min.  Goal of Therapy:  Heparin level 0.3-0.7 units/ml Monitor platelets by  anticoagulation protocol: Yes   Plan:  Heparin IV bolus 4000 units Heparin gtt at 1600 units/hr Will f/u 8 hr heparin level Daily heparin level and CBC  Christoper Fabianaron Skyah Hannon, PharmD, BCPS Clinical pharmacist, pager [phone] 06/04/2015,11:06 PM

## 2015-06-04 NOTE — Progress Notes (Signed)
*  PRELIMINARY RESULTS* Echocardiogram 2D Echocardiogram with definity has been performed.  Jeryl Columbialliott, Derwin Reddy 06/04/2015, 4:14 PM

## 2015-06-05 ENCOUNTER — Inpatient Hospital Stay (HOSPITAL_COMMUNITY): Payer: Medicaid Other

## 2015-06-05 ENCOUNTER — Encounter (HOSPITAL_COMMUNITY): Payer: Self-pay | Admitting: Radiology

## 2015-06-05 DIAGNOSIS — E785 Hyperlipidemia, unspecified: Secondary | ICD-10-CM | POA: Diagnosis present

## 2015-06-05 DIAGNOSIS — I5021 Acute systolic (congestive) heart failure: Secondary | ICD-10-CM | POA: Diagnosis present

## 2015-06-05 DIAGNOSIS — I4891 Unspecified atrial fibrillation: Secondary | ICD-10-CM | POA: Insufficient documentation

## 2015-06-05 DIAGNOSIS — N189 Chronic kidney disease, unspecified: Secondary | ICD-10-CM

## 2015-06-05 DIAGNOSIS — E871 Hypo-osmolality and hyponatremia: Secondary | ICD-10-CM | POA: Diagnosis present

## 2015-06-05 DIAGNOSIS — E118 Type 2 diabetes mellitus with unspecified complications: Secondary | ICD-10-CM | POA: Diagnosis present

## 2015-06-05 DIAGNOSIS — N19 Unspecified kidney failure: Secondary | ICD-10-CM | POA: Insufficient documentation

## 2015-06-05 DIAGNOSIS — D631 Anemia in chronic kidney disease: Secondary | ICD-10-CM | POA: Diagnosis present

## 2015-06-05 DIAGNOSIS — I236 Thrombosis of atrium, auricular appendage, and ventricle as current complications following acute myocardial infarction: Secondary | ICD-10-CM | POA: Diagnosis present

## 2015-06-05 DIAGNOSIS — E872 Acidosis, unspecified: Secondary | ICD-10-CM | POA: Diagnosis present

## 2015-06-05 LAB — GLUCOSE, CAPILLARY: GLUCOSE-CAPILLARY: 159 mg/dL — AB (ref 65–99)

## 2015-06-05 LAB — PROTIME-INR
INR: 1.45 (ref 0.00–1.49)
Prothrombin Time: 17.7 seconds — ABNORMAL HIGH (ref 11.6–15.2)

## 2015-06-05 LAB — RENAL FUNCTION PANEL
Albumin: 3.1 g/dL — ABNORMAL LOW (ref 3.5–5.0)
Anion gap: 13 (ref 5–15)
BUN: 100 mg/dL — ABNORMAL HIGH (ref 6–20)
CO2: 22 mmol/L (ref 22–32)
Calcium: 8.4 mg/dL — ABNORMAL LOW (ref 8.9–10.3)
Chloride: 96 mmol/L — ABNORMAL LOW (ref 101–111)
Creatinine, Ser: 8.62 mg/dL — ABNORMAL HIGH (ref 0.61–1.24)
GFR calc Af Amer: 7 mL/min — ABNORMAL LOW (ref >60)
GFR calc non Af Amer: 6 mL/min — ABNORMAL LOW (ref >60)
Glucose, Bld: 100 mg/dL — ABNORMAL HIGH (ref 65–99)
Phosphorus: 9.2 mg/dL — ABNORMAL HIGH (ref 2.5–4.6)
Potassium: 5.2 mmol/L — ABNORMAL HIGH (ref 3.5–5.1)
Sodium: 131 mmol/L — ABNORMAL LOW (ref 135–145)

## 2015-06-05 LAB — HEPARIN LEVEL (UNFRACTIONATED)
Heparin Unfractionated: 0.54 IU/mL (ref 0.30–0.70)
Heparin Unfractionated: 0.27 IU/mL — ABNORMAL LOW (ref 0.30–0.70)

## 2015-06-05 LAB — LIPID PANEL
CHOL/HDL RATIO: 4 ratio
Cholesterol: 108 mg/dL (ref 0–200)
HDL: 27 mg/dL — ABNORMAL LOW (ref >40)
LDL Cholesterol: 57 mg/dL (ref 0–99)
TRIGLYCERIDES: 121 mg/dL (ref <150)
VLDL: 24 mg/dL (ref 0–40)

## 2015-06-05 LAB — CBC
HCT: 36.9 % — ABNORMAL LOW (ref 39.0–52.0)
Hemoglobin: 12 g/dL — ABNORMAL LOW (ref 13.0–17.0)
MCH: 21.2 pg — ABNORMAL LOW (ref 26.0–34.0)
MCHC: 32.5 g/dL (ref 30.0–36.0)
MCV: 65.1 fL — ABNORMAL LOW (ref 78.0–100.0)
Platelets: 229 10*3/uL (ref 150–400)
RBC: 5.67 MIL/uL (ref 4.22–5.81)
RDW: 18.1 % — ABNORMAL HIGH (ref 11.5–15.5)
WBC: 19.3 10*3/uL — ABNORMAL HIGH (ref 4.0–10.5)

## 2015-06-05 LAB — FERRITIN: Ferritin: 133 ng/mL (ref 24–336)

## 2015-06-05 LAB — TSH: TSH: 1.196 u[IU]/mL (ref 0.350–4.500)

## 2015-06-05 MED ORDER — WARFARIN VIDEO
Freq: Once | Status: DC
Start: 2015-06-05 — End: 2015-06-10

## 2015-06-05 MED ORDER — COUMADIN BOOK
Freq: Once | Status: AC
  Administered 2015-06-05: 1
  Filled 2015-06-05: qty 1

## 2015-06-05 MED ORDER — WARFARIN SODIUM 7.5 MG PO TABS
7.5000 mg | ORAL_TABLET | Freq: Once | ORAL | Status: AC
  Administered 2015-06-05: 7.5 mg via ORAL
  Filled 2015-06-05: qty 1

## 2015-06-05 MED ORDER — SODIUM CHLORIDE 0.9 % IV BOLUS (SEPSIS)
250.0000 mL | Freq: Once | INTRAVENOUS | Status: AC
  Administered 2015-06-05: 250 mL via INTRAVENOUS

## 2015-06-05 MED ORDER — AMIODARONE HCL IN DEXTROSE 360-4.14 MG/200ML-% IV SOLN
30.0000 mg/h | INTRAVENOUS | Status: DC
  Filled 2015-06-05 (×2): qty 200

## 2015-06-05 MED ORDER — METOPROLOL TARTRATE 12.5 MG HALF TABLET
12.5000 mg | ORAL_TABLET | Freq: Four times a day (QID) | ORAL | Status: DC
  Administered 2015-06-05: 12.5 mg via ORAL
  Filled 2015-06-05 (×5): qty 1

## 2015-06-05 MED ORDER — FUROSEMIDE 10 MG/ML IJ SOLN
120.0000 mg | Freq: Once | INTRAVENOUS | Status: DC
  Filled 2015-06-05: qty 12

## 2015-06-05 MED ORDER — AMIODARONE LOAD VIA INFUSION
150.0000 mg | Freq: Once | INTRAVENOUS | Status: AC
  Administered 2015-06-05: 150 mg via INTRAVENOUS
  Filled 2015-06-05: qty 83.34

## 2015-06-05 MED ORDER — SODIUM CHLORIDE 0.9 % IV SOLN: INTRAVENOUS | Status: DC | PRN

## 2015-06-05 MED ORDER — DEXTROSE 5 % IV SOLN
0.0000 ug/min | INTRAVENOUS | Status: DC
  Administered 2015-06-05: 2 ug/min via INTRAVENOUS
  Filled 2015-06-05: qty 4

## 2015-06-05 MED ORDER — WARFARIN - PHARMACIST DOSING INPATIENT: Freq: Every day | Status: DC

## 2015-06-05 MED ORDER — ALUM & MAG HYDROXIDE-SIMETH 200-200-20 MG/5ML PO SUSP
30.0000 mL | Freq: Four times a day (QID) | ORAL | Status: DC | PRN
Start: 2015-06-05 — End: 2015-06-12
  Administered 2015-06-05: 30 mL via ORAL
  Filled 2015-06-05: qty 30

## 2015-06-05 MED ORDER — NOREPINEPHRINE BITARTRATE 1 MG/ML IV SOLN
0.0000 ug/min | INTRAVENOUS | Status: DC
  Administered 2015-06-05 – 2015-06-07 (×2): 10 ug/min via INTRAVENOUS
  Administered 2015-06-09: 5 ug/min via INTRAVENOUS
  Administered 2015-06-10: 16 ug/min via INTRAVENOUS
  Administered 2015-06-11: 40 ug/min via INTRAVENOUS
  Administered 2015-06-11: 22 ug/min via INTRAVENOUS
  Administered 2015-06-12 (×2): 40 ug/min via INTRAVENOUS
  Filled 2015-06-05 (×11): qty 16

## 2015-06-05 MED ORDER — HEPARIN (PORCINE) IN NACL 100-0.45 UNIT/ML-% IJ SOLN
2100.0000 [IU]/h | INTRAMUSCULAR | Status: DC
  Administered 2015-06-06: 1800 [IU]/h via INTRAVENOUS
  Filled 2015-06-05 (×3): qty 250

## 2015-06-05 MED ORDER — AMIODARONE HCL IN DEXTROSE 360-4.14 MG/200ML-% IV SOLN
60.0000 mg/h | INTRAVENOUS | Status: DC
  Administered 2015-06-05 (×2): 60 mg/h via INTRAVENOUS
  Filled 2015-06-05: qty 200

## 2015-06-05 MED ORDER — METOPROLOL TARTRATE 12.5 MG HALF TABLET
12.5000 mg | ORAL_TABLET | Freq: Four times a day (QID) | ORAL | Status: DC
  Filled 2015-06-05 (×8): qty 1

## 2015-06-05 NOTE — Progress Notes (Signed)
Pt was up to BCS and got up alone and fell face forward.  Was found on floor on face.  No obvious injury, no hematoma, pupils react, but light bothers his eyes.  MAE, follows commands.  Grips are equal.  EOMs intact. BP difficult to monitor will add arterial line.

## 2015-06-05 NOTE — Progress Notes (Addendum)
ANTICOAGULATION CONSULT NOTE - Follow Up Consult  Pharmacy Consult for heparin/coumadin Indication: afib, apical thrombus seen on ECHO  Allergies  Allergen Reactions  . Shrimp [Shellfish Allergy] Shortness Of Breath    Patient Measurements: Height: 5\' 11"  (180.3 cm) Weight: 264 lb 12.8 oz (120.112 kg) IBW/kg (Calculated) : 75.3 Heparin Dosing Weight: 100kg  Vital Signs: Temp: 98 F (36.7 C) (06/30 0800) Temp Source: Oral (06/30 0800) BP: 64/43 mmHg (06/30 0900) Pulse Rate: 74 (06/30 0900)  Labs:  Recent Labs  05/13/2015 0900 05/26/2015 1003 05/31/2015 1546 05/26/2015 2113 06/04/15 0226 06/05/15 0500 06/05/15 0815  HGB 12.5* 13.9  --   --  10.4*  --  12.0*  HCT 39.9 41.0  --   --  33.4*  --  36.9*  PLT 178  --   --   --  138*  --  229  LABPROT  --   --   --   --   --   --  17.7*  INR  --   --   --   --   --   --  1.45  HEPARINUNFRC  --   --   --   --   --   --  0.54  CREATININE 5.94* 4.30*  --  6.16* 6.54* 8.62*  --   TROPONINI  --   --  >65.00*  --   --   --   --     Estimated Creatinine Clearance: 12.9 mL/min (by C-G formula based on Cr of 8.62).   Medications:  Scheduled:  . aspirin EC  81 mg Oral Daily  . atorvastatin  80 mg Oral q1800  . metoprolol tartrate  12.5 mg Oral 4 times per day  . sodium bicarbonate  1,300 mg Oral BID  . sodium chloride  3 mL Intravenous Q12H  . ticagrelor  90 mg Oral BID   Infusions:  . amiodarone 60 mg/hr (06/05/15 0843)   Followed by  . amiodarone    . heparin 1,600 Units/hr (06/05/15 0940)  .  sodium bicarbonate 150 mEq in sterile water 1000 mL infusion      Assessment: 54 y.o. male presented with STEMI - s/p cath and stent 6/28. He is on heparin for afib and apical thrombus seen on ECHO. Also to begin coumadin today (baseline INR= 1.45). Hg/hct= 12/36.9, plt= 229.  Goal of Therapy:  INR 2-3 Heparin level 0.3-0.7 units/ml Monitor platelets by anticoagulation protocol: Yes   Plan:  -Continue heparin at 1600  units/hr -Will confirm a heparin level later today -Daily heparin level and CBC -Coumadin 7.5mg  po today -Daily PT/INR  Harland GermanAndrew Meyer, Pharm D 06/05/2015 10:00 AM    ===============================   Addendum: - HL 0.27, sub-therapeutic - no issue with infusion nor bleeding reported   Plan: - Increase heparin gtt to 1800 units/hr - Check 8 hr HL    Annet Manukyan D. Laney Potashang, PharmD, BCPS Pager:  [phone] 06/05/2015, 6:43 PM

## 2015-06-05 NOTE — Progress Notes (Signed)
RT attempted twice to place arterial line was unable to successfully obtain blood flow. RN will notify CCM to place Arterial line.

## 2015-06-05 NOTE — Progress Notes (Addendum)
Pt found by RN x 2 on floor between bedside commode and pt bed; tele leads /IV became loosened/DC'd during event; pt states he remembers standing up from bedside commode and transferring to bed, but does not remember any events following sitting on bed; pt remains alert and oriented, apologetic for ambulating without calling for help; denies pain; moves all extremities; VS per flowsheet; CCM/cardiology aware; awaiting arrival of NP cards for orders; will continue to closely monitor

## 2015-06-05 NOTE — Progress Notes (Addendum)
Subjective:  No recurrent chest pain; developed RUQ discomfort relieved with Maalox. Complains of increased dyspnea. Generally feels poor. Went into Afib with RVR last night.   Objective:   Vital Signs : Filed Vitals:   06/05/15 0301 06/05/15 0400 06/05/15 0515 06/05/15 0700  BP:  135/84 103/53 101/68  Pulse:  33 48 100  Temp: 98.3 F (36.8 C)     TempSrc: Oral     Resp:  _0 Height:      Weight:  120.112 kg (264 lb 12.8 oz)    SpO2:  100% 98% 100%    Intake/Output from previous day:  Intake/Output Summary (Last 24 hours) at 06/05/15 0739 Last data filed at 06/05/15 0700  Gross per 24 hour  Intake  981.6 ml  Output    450 ml  Net  531.6 ml    I/O since admission:  +907  Wt Readings from Last 3 Encounters:  06/05/15 120.112 kg (264 lb 12.8 oz)  06/30/14 125.556 kg (276 lb 12.8 oz)  04/11/13 124.286 kg (274 lb)    Medications: . amiodarone  150 mg Intravenous Once  . aspirin EC  81 mg Oral Daily  . atorvastatin  80 mg Oral q1800  . furosemide  120 mg Intravenous Once  . metoprolol tartrate  12.5 mg Oral 4 times per day  . sodium bicarbonate  1,300 mg Oral BID  . sodium chloride  3 mL Intravenous Q12H  . ticagrelor  90 mg Oral BID    . amiodarone     Followed by  . amiodarone    . heparin 1,600 Units/hr (06/04/15 2324)  .  sodium bicarbonate 150 mEq in sterile water 1000 mL infusion      Physical Exam:   General appearance: alert, cooperative, obese Neck: no adenopathy, no carotid bruit, supple, symmetrical, trachea midline and thyroid not enlarged, symmetric, no tenderness/mass/nodules; JVD 7-8 cm Lungs: clear to auscultation bilaterally Heart: Tachycardic, IRR, 1/6 sem, no diastolic murmur; no rub Abdomen: soft, obese, non-tender; bowel sounds normal; no masses,  no organomegaly Extremities: extremities normal, atraumatic, no cyanosis or edema and no edema, redness or tenderness in the calves or thighs Pulses: 2+ and symmetric Skin: Skin color,  texture, turgor normal. No rashes or lesions Neurologic: Grossly normal   Rate: 123  Rhythm: atrial fibrillation  ECG (independently read by me):  Atrial fibrillation with RVR.   Q 2,3 aVF with residual 2 mm ST elevation; T wave inversion V2-3 I aVL      Lab Results:   Recent Labs  05/21/2015 2113 06/04/15 0226 06/05/15 0500  NA 129* 131* 131*  K 6.0* 5.1 5.2*  CL 102 99* 96*  CO2 17* 18* 22  GLUCOSE 120* 123* 100*  BUN 71* 75* 100*  CREATININE 6.16* 6.54* 8.62*  CALCIUM 8.4* 8.4* 8.4*  PHOS  --   --  9.2*    Hepatic Function Latest Ref Rng 06/05/2015 05/16/2012  Total Protein 6.0 - 8.3 g/dL - 7.2  Albumin 3.5 - 5.0 g/dL 3.1(L) 4.5  AST 0 - 37 U/L - 19  ALT 0 - 53 U/L - 24  Alk Phosphatase 39 - 117 U/L - 62  Total Bilirubin 0.3 - 1.2 mg/dL - 0.6     Recent Labs  05/24/2015 0900 05/30/2015 1003 06/04/15 0226  WBC 21.0*  --  13.5*  NEUTROABS 17.2*  --   --   HGB 12.5* 13.9 10.4*  HCT 39.9 41.0 33.4*  MCV 67.2*  --  66.7*  PLT 178  --  138*     Recent Labs  05/21/2015 1546  TROPONINI >65.00*    No results found for: TSH No results for input(s): HGBA1C in the last 72 hours.   Recent Labs  06/05/15 0500  ALBUMIN 3.1*   No results for input(s): INR in the last 72 hours. BNP (last 3 results)  Recent Labs  06/04/15 1136  BNP 679.9*    ProBNP (last 3 results) No results for input(s): PROBNP in the last 8760 hours.   Lipid Panel     Component Value Date/Time   CHOL 253* 05/16/2012 1750   TRIG 184* 05/16/2012 1750   HDL 35* 05/16/2012 1750   CHOLHDL 7.2 05/16/2012 1750   VLDL 37 05/16/2012 1750   LDLCALC 181* 05/16/2012 1750         Imaging:  US Renal  06/04/2015   CLINICAL DATA:  Renal failure.  Initial encounter.  EXAM: RENAL / URINARY TRACT ULTRASOUND COMPLETE  COMPARISON:  None.  FINDINGS: Right Kidney:  Length: 12.3 cm. Echogenicity within normal limits. No mass or hydronephrosis visualized.  Left Kidney:  Length: 12.2 cm. Echogenicity  within normal limits. No mass or hydronephrosis visualized.  Bladder:  Decompressed and poorly visualized.  IMPRESSION: Normal renal ultrasound. No evidence of hydronephrosis. Bladder poorly visualized.   Electronically Signed   By: Richardean Sale M.D.   On: 06/04/2015 20:54   Dg Chest Port 1 View  05/23/2015   CLINICAL DATA:  Chest pain  EXAM: PORTABLE CHEST - 1 VIEW  COMPARISON:  None.  FINDINGS: Cardiac shadow is enlarged. The lungs are clear bilaterally. No acute bony abnormality is seen.  IMPRESSION: No acute abnormality noted.   Electronically Signed   By: Inez Catalina M.D.   On: 05/23/2015 09:30    Echo: Study Conclusions  - Left ventricle: The cavity size was normal. Systolic function was severely reduced. The estimated ejection fraction was in the range of 25% to 30%. Doppler parameters are consistent with elevated ventricular end-diastolic filling pressure. - Aortic valve: Trileaflet; normal thickness leaflets. There was no regurgitation. - Aortic root: The aortic root was normal in size. - Mitral valve: Calcified annulus. Mildly thickened leaflets . There was trivial regurgitation. - Left atrium: The atrium was mildly dilated. - Right ventricle: The cavity size was normal. Wall thickness was normal. Systolic function was normal. - Tricuspid valve: There was mild regurgitation. - Pulmonary arteries: The main pulmonary artery was normal-sized. Systolic pressure was within the normal range. - Inferior vena cava: The vessel was normal in size. The respirophasic diameter changes were in the normal range (>= 50%), consistent with normal central venous pressure. - Pericardium, extracardiac: There was no pericardial effusion.  Impressions:  - There is diffuse hypokinesis with akinesis of the apical segments with an apical thrombus, overal LVEF is severely decreased estimated at 25-30%. A systemic anticoagulation is recommended.   Assessment/Plan:    Active Problems:   STEMI (ST elevation myocardial infarction)   Acute renal failure syndrome   ST elevation myocardial infarction involving left circumflex coronary artery   ST elevation myocardial infarction (STEMI) involving right coronary artery with complication  1. STEMI: Probably initiated 3 days PTA with RCA; suspect LCX and LAD occlusions are chronic.  S/P PTCA/thrombectomy, DES stenting from proximal to mid RCA with PTCA at acute margin and distal RCA (could not pass stent through sharp angled acute margin).  Troponin>65 2. Severe 3 vessel CAD with also total mid-distal LAD occlusion and  proximal LCx occlusion. On DAPT with ASA and Brilinta. Will need to add Coumadin due to Afib and LV thrombus. Consider switching Brilinta to Plavix prior to DC but I am hesitant to switch at this critical juncture.  3. Atrial fibrillation with RVR: this is exacerbating his dyspnea with low cardiac output and low EF. Will initiate IV amiodarone. Continue po metoprolol. On IV heparin. Will need long term anticoagulation with Coumadin. (not a NOAC candidate with RF). 4. Acute renal failure. BUN and creatinine rising. Potassium 5.2 today. Mildly acidotic. I think it is likely he will need dialysis. Dr. Florene Glen and the Renal team are consulting.  5. Acute systolic CHF. EF 30%. Increased dyspnea noted. Will check CXR. Given Lasix 120 mg IV now. May need dialysis/CVVH to maintain volume status.  6. Metabolic acidosis with CO2 22 7. LV apical thrombus. Needs anticoagulation in addition to DAPT.  8. Microcytic anemia; prob contributed by renal disease but ? Fe deficiency or thallasemia/sickle trait 9. Hyperlidemia: LDL 181, now on atorvastatin 80 mg 10. Type 2 DM 11. Obesity 12. Hyponatremia.     Peter Martinique MD, [facility]    06/05/2015, 7:39 AM   When IV amiodarone bolus given BP dropped. Now 62/41. HR 110. Patient is asymptomatic. Will hold lasix. Give 250 cc fluid bolus. May need to consider pressors if BP  does not improve.  Peter Martinique MD, [facility]

## 2015-06-05 NOTE — Progress Notes (Addendum)
Pt converted from atrial fib into normal saline with amiodarone infusing at 1015; pt remains restless, states he can't get comfortable in bed; repeatedly asks to get OOB to chair; bed placed in chair position; venti mask applied per MD verbal orders (Dr Lowell GuitarPowell); will continue to closely monitor

## 2015-06-05 NOTE — Progress Notes (Signed)
eLink Physician-Brief Progress Note Patient Name: Joe Love DOB: 1961-10-29 MRN: [id number]   Date of Service  06/05/2015  HPI/Events of Note  Bedside RN informed patient fell of f bed. Category 2 patient  eICU Interventions  Advised to inform primary service      Intervention Category Evaluation Type: Other  Keelyn Fjelstad 06/05/2015, 7:11 PM

## 2015-06-05 NOTE — Progress Notes (Signed)
Assessment:  1 Worsening Renal Failure, acute v chronic; exacerbated by hemodynamic insult and contrast.  I anticipate need for dialysis in next day or say. 2 Hyperkalemia, resolved 3 S/P IWMI s/p intervention 4 Diabetes mellitus 5 Hypertension, poorly controlled in the past  Subjective: Interval History: Agitated.  AFib transiently  Objective: Vital signs in last 24 hours: Temp:  [98 F (36.7 C)-98.7 F (37.1 C)] 98 F (36.7 C) (06/30 0800) Pulse Rate:  [33-137] 54 (06/30 1000) Resp:  [10-47] 18 (06/30 1000) BP: (56-135)/(32-93) 72/50 mmHg (06/30 1000) SpO2:  [90 %-100 %] 100 % (06/30 1000) Weight:  [120.112 kg (264 lb 12.8 oz)] 120.112 kg (264 lb 12.8 oz) (06/30 0400) Weight change: 1.213 kg (2 lb 10.8 oz)  Intake/Output from previous day: 06/29 0701 - 06/30 0700 In: 981.6 [P.O.:720; I.V.:261.6] Out: 450 [Urine:450] Intake/Output this shift: Total I/O In: 257.1 [I.V.:257.1] Out: -   General appearance: moderate distress, severe agitation sitting up gasping for air Resp: clear to auscultation bilaterally Chest wall: no tenderness Cardio: regular rate and rhythm, S1, S2 normal, no murmur, click, rub or gallop Extremities: edema 1+  Lab Results:  Recent Labs  06/04/15 0226 06/05/15 0815  WBC 13.5* 19.3*  HGB 10.4* 12.0*  HCT 33.4* 36.9*  PLT 138* 229   BMET:  Recent Labs  06/04/15 0226 06/05/15 0500  NA 131* 131*  K 5.1 5.2*  CL 99* 96*  CO2 18* 22  GLUCOSE 123* 100*  BUN 75* 100*  CREATININE 6.54* 8.62*  CALCIUM 8.4* 8.4*   No results for input(s): PTH in the last 72 hours. Iron Studies:  Recent Labs  06/04/15 1136 06/05/15 0815  IRON 9*  --   TIBC 354  --   FERRITIN  --  133   Studies/Results: [place] Renal  06/04/2015   CLINICAL DATA:  Renal failure.  Initial encounter.  EXAM: RENAL / URINARY TRACT ULTRASOUND COMPLETE  COMPARISON:  None.  FINDINGS: Right Kidney:  Length: 12.3 cm. Echogenicity within normal limits. No mass or hydronephrosis  visualized.  Left Kidney:  Length: 12.2 cm. Echogenicity within normal limits. No mass or hydronephrosis visualized.  Bladder:  Decompressed and poorly visualized.  IMPRESSION: Normal renal ultrasound. No evidence of hydronephrosis. Bladder poorly visualized.   Electronically Signed   By: Carey BullocksWilliam  Veazey M.D.   On: 06/04/2015 20:54   Dg Chest Port 1 View  06/05/2015   CLINICAL DATA:  CHF  EXAM: PORTABLE CHEST - 1 VIEW  COMPARISON:  01-26-2015  FINDINGS: Moderate enlargement of the cardiomediastinal silhouette reidentified. Lungs are hypoaerated but clear. No pleural effusion. Cardiac leads overlie the chest. No acute osseous finding.  IMPRESSION: No acute cardiopulmonary process. Cardiomegaly without focal acute finding.   Electronically Signed   By: Christiana PellantGretchen  Green M.D.   On: 06/05/2015 08:30   Scheduled: . aspirin EC  81 mg Oral Daily  . atorvastatin  80 mg Oral q1800  . coumadin book   Does not apply Once  . metoprolol tartrate  12.5 mg Oral 4 times per day  . sodium bicarbonate  1,300 mg Oral BID  . sodium chloride  3 mL Intravenous Q12H  . ticagrelor  90 mg Oral BID  . warfarin  7.5 mg Oral ONCE-1800  . warfarin   Does not apply Once  . Warfarin - Pharmacist Dosing Inpatient   Does not apply q1800     LOS: 2 days   Angelena Sand C 06/05/2015,10:50 AM

## 2015-06-05 NOTE — Progress Notes (Signed)
EKG CRITICAL VALUE     12 lead EKG performed.  Critical value noted.  Laury AxonMelvin Amo, RN notified.   Rhiley Solem H, CCT 06/05/2015 6:58 AM

## 2015-06-06 ENCOUNTER — Inpatient Hospital Stay (HOSPITAL_COMMUNITY): Payer: Medicaid Other

## 2015-06-06 DIAGNOSIS — I480 Paroxysmal atrial fibrillation: Secondary | ICD-10-CM

## 2015-06-06 DIAGNOSIS — I513 Intracardiac thrombosis, not elsewhere classified: Secondary | ICD-10-CM

## 2015-06-06 DIAGNOSIS — I213 ST elevation (STEMI) myocardial infarction of unspecified site: Secondary | ICD-10-CM

## 2015-06-06 LAB — PROTEIN ELECTROPHORESIS, SERUM
A/G Ratio: 1.3 (ref 0.7–1.7)
ALPHA-1-GLOBULIN: 0.2 g/dL (ref 0.0–0.4)
Albumin ELP: 3.3 g/dL (ref 2.9–4.4)
Alpha-2-Globulin: 0.5 g/dL (ref 0.4–1.0)
BETA GLOBULIN: 0.9 g/dL (ref 0.7–1.3)
Gamma Globulin: 0.9 g/dL (ref 0.4–1.8)
Globulin, Total: 2.5 g/dL (ref 2.2–3.9)
TOTAL PROTEIN ELP: 5.8 g/dL — AB (ref 6.0–8.5)

## 2015-06-06 LAB — CBC
HCT: 34.7 % — ABNORMAL LOW (ref 39.0–52.0)
HEMOGLOBIN: 11.1 g/dL — AB (ref 13.0–17.0)
MCH: 21 pg — ABNORMAL LOW (ref 26.0–34.0)
MCHC: 32 g/dL (ref 30.0–36.0)
MCV: 65.7 fL — ABNORMAL LOW (ref 78.0–100.0)
PLATELETS: 231 10*3/uL (ref 150–400)
RBC: 5.28 MIL/uL (ref 4.22–5.81)
RDW: 18.4 % — ABNORMAL HIGH (ref 11.5–15.5)
WBC: 19.1 10*3/uL — ABNORMAL HIGH (ref 4.0–10.5)

## 2015-06-06 LAB — CBC WITH DIFFERENTIAL/PLATELET
BASOS ABS: 0 10*3/uL (ref 0.0–0.1)
Basophils Absolute: 0 10*3/uL (ref 0.0–0.1)
Basophils Relative: 0 % (ref 0–1)
Basophils Relative: 0 % (ref 0–1)
EOS PCT: 1 % (ref 0–5)
EOS PCT: 1 % (ref 0–5)
Eosinophils Absolute: 0.2 10*3/uL (ref 0.0–0.7)
Eosinophils Absolute: 0.1 10*3/uL (ref 0.0–0.7)
HCT: 27.9 % — ABNORMAL LOW (ref 39.0–52.0)
HEMATOCRIT: 25.4 % — AB (ref 39.0–52.0)
HEMOGLOBIN: 8.8 g/dL — AB (ref 13.0–17.0)
Hemoglobin: 8.2 g/dL — ABNORMAL LOW (ref 13.0–17.0)
LYMPHS ABS: 0.8 10*3/uL (ref 0.7–4.0)
Lymphocytes Relative: 8 % — ABNORMAL LOW (ref 12–46)
Lymphocytes Relative: 6 % — ABNORMAL LOW (ref 12–46)
Lymphs Abs: 1.2 10*3/uL (ref 0.7–4.0)
MCH: 20.7 pg — AB (ref 26.0–34.0)
MCH: 20.9 pg — ABNORMAL LOW (ref 26.0–34.0)
MCHC: 31.5 g/dL (ref 30.0–36.0)
MCHC: 32.3 g/dL (ref 30.0–36.0)
MCV: 65.6 fL — ABNORMAL LOW (ref 78.0–100.0)
MCV: 64.6 fL — ABNORMAL LOW (ref 78.0–100.0)
MONO ABS: 1.2 10*3/uL — AB (ref 0.1–1.0)
MONO ABS: 1.4 10*3/uL — AB (ref 0.1–1.0)
MONOS PCT: 10 % (ref 3–12)
Monocytes Relative: 8 % (ref 3–12)
NEUTROS ABS: 12.6 10*3/uL — AB (ref 1.7–7.7)
NEUTROS ABS: 11.5 10*3/uL — AB (ref 1.7–7.7)
Neutrophils Relative %: 83 % — ABNORMAL HIGH (ref 43–77)
Neutrophils Relative %: 83 % — ABNORMAL HIGH (ref 43–77)
Platelets: 211 10*3/uL (ref 150–400)
Platelets: 188 10*3/uL (ref 150–400)
RBC: 4.25 MIL/uL (ref 4.22–5.81)
RBC: 3.93 MIL/uL — AB (ref 4.22–5.81)
RDW: 18.4 % — ABNORMAL HIGH (ref 11.5–15.5)
RDW: 18.3 % — ABNORMAL HIGH (ref 11.5–15.5)
WBC: 15.2 10*3/uL — ABNORMAL HIGH (ref 4.0–10.5)
WBC: 13.8 10*3/uL — ABNORMAL HIGH (ref 4.0–10.5)

## 2015-06-06 LAB — RENAL FUNCTION PANEL
Albumin: 2.7 g/dL — ABNORMAL LOW (ref 3.5–5.0)
Anion gap: 13 (ref 5–15)
BUN: 110 mg/dL — ABNORMAL HIGH (ref 6–20)
CHLORIDE: 97 mmol/L — AB (ref 101–111)
CO2: 22 mmol/L (ref 22–32)
Calcium: 6.6 mg/dL — ABNORMAL LOW (ref 8.9–10.3)
Creatinine, Ser: 7.11 mg/dL — ABNORMAL HIGH (ref 0.61–1.24)
GFR calc non Af Amer: 8 mL/min — ABNORMAL LOW (ref >60)
GFR, EST AFRICAN AMERICAN: 9 mL/min — AB (ref >60)
Glucose, Bld: 165 mg/dL — ABNORMAL HIGH (ref 65–99)
PHOSPHORUS: 7.6 mg/dL — AB (ref 2.5–4.6)
POTASSIUM: 4.4 mmol/L (ref 3.5–5.1)
Sodium: 132 mmol/L — ABNORMAL LOW (ref 135–145)

## 2015-06-06 LAB — BASIC METABOLIC PANEL
Anion gap: 20 — ABNORMAL HIGH (ref 5–15)
BUN: 131 mg/dL — ABNORMAL HIGH (ref 6–20)
CHLORIDE: 93 mmol/L — AB (ref 101–111)
CO2: 19 mmol/L — ABNORMAL LOW (ref 22–32)
Calcium: 7.7 mg/dL — ABNORMAL LOW (ref 8.9–10.3)
Creatinine, Ser: 10.53 mg/dL — ABNORMAL HIGH (ref 0.61–1.24)
GFR calc Af Amer: 6 mL/min — ABNORMAL LOW (ref >60)
GFR calc non Af Amer: 5 mL/min — ABNORMAL LOW (ref >60)
GLUCOSE: 152 mg/dL — AB (ref 65–99)
POTASSIUM: 7.4 mmol/L — AB (ref 3.5–5.1)
Sodium: 132 mmol/L — ABNORMAL LOW (ref 135–145)

## 2015-06-06 LAB — HEMOGLOBIN A1C
HEMOGLOBIN A1C: 9.9 % — AB (ref 4.8–5.6)
MEAN PLASMA GLUCOSE: 237 mg/dL

## 2015-06-06 LAB — PROTIME-INR
INR: 1.85 — ABNORMAL HIGH (ref 0.00–1.49)
Prothrombin Time: 21.3 seconds — ABNORMAL HIGH (ref 11.6–15.2)

## 2015-06-06 LAB — HEPARIN LEVEL (UNFRACTIONATED): Heparin Unfractionated: 0.15 IU/mL — ABNORMAL LOW (ref 0.30–0.70)

## 2015-06-06 MED ORDER — INSULIN ASPART 100 UNIT/ML ~~LOC~~ SOLN: 10.0000 [IU] | Freq: Once | SUBCUTANEOUS | Status: DC

## 2015-06-06 MED ORDER — SODIUM CHLORIDE 0.9 % IV SOLN
2.0000 g | Freq: Once | INTRAVENOUS | Status: AC
  Administered 2015-06-06: 2 g via INTRAVENOUS
  Filled 2015-06-06: qty 20

## 2015-06-06 MED ORDER — DEXTROSE 50 % IV SOLN
1.0000 | Freq: Once | INTRAVENOUS | Status: AC
  Administered 2015-06-06: 50 mL via INTRAVENOUS
  Filled 2015-06-06: qty 50

## 2015-06-06 MED ORDER — FERROUS SULFATE 325 (65 FE) MG PO TABS
325.0000 mg | ORAL_TABLET | Freq: Two times a day (BID) | ORAL | Status: DC
  Administered 2015-06-06 – 2015-06-10 (×9): 325 mg via ORAL
  Filled 2015-06-06 (×11): qty 1

## 2015-06-06 MED ORDER — PANTOPRAZOLE SODIUM 40 MG PO TBEC: 40.0000 mg | DELAYED_RELEASE_TABLET | Freq: Every day | ORAL | Status: DC

## 2015-06-06 MED ORDER — HEPARIN SODIUM (PORCINE) 1000 UNIT/ML DIALYSIS
1000.0000 [IU] | INTRAMUSCULAR | Status: DC | PRN
  Administered 2015-06-06: 3000 [IU] via INTRAVENOUS_CENTRAL
  Administered 2015-06-08: 2400 [IU] via INTRAVENOUS_CENTRAL
  Administered 2015-06-12: 3000 [IU] via INTRAVENOUS_CENTRAL
  Filled 2015-06-06: qty 6
  Filled 2015-06-06: qty 3
  Filled 2015-06-06 (×4): qty 6

## 2015-06-06 MED ORDER — PRISMASOL BGK 0/2.5 32-2.5 MEQ/L IV SOLN
INTRAVENOUS | Status: DC
  Administered 2015-06-06 – 2015-06-08 (×19): via INTRAVENOUS_CENTRAL
  Filled 2015-06-06 (×31): qty 5000

## 2015-06-06 MED ORDER — WARFARIN SODIUM 7.5 MG PO TABS
7.5000 mg | ORAL_TABLET | Freq: Once | ORAL | Status: DC
  Filled 2015-06-06: qty 1

## 2015-06-06 MED ORDER — PANTOPRAZOLE SODIUM 40 MG IV SOLR
40.0000 mg | INTRAVENOUS | Status: DC
  Administered 2015-06-06: 40 mg via INTRAVENOUS
  Filled 2015-06-06 (×2): qty 40

## 2015-06-06 MED ORDER — SODIUM CHLORIDE 0.9 % FOR CRRT
INTRAVENOUS_CENTRAL | Status: DC | PRN
  Filled 2015-06-06: qty 1000

## 2015-06-06 MED ORDER — PRISMASOL BGK 0/2.5 32-2.5 MEQ/L IV SOLN
INTRAVENOUS | Status: DC
  Administered 2015-06-06 (×2): via INTRAVENOUS_CENTRAL
  Filled 2015-06-06 (×5): qty 5000

## 2015-06-06 MED ORDER — PRISMASOL BGK 0/2.5 32-2.5 MEQ/L IV SOLN
INTRAVENOUS | Status: DC
  Administered 2015-06-06 – 2015-06-07 (×3): via INTRAVENOUS_CENTRAL
  Filled 2015-06-06 (×3): qty 5000

## 2015-06-06 MED ORDER — DEXTROSE 50 % IV SOLN
INTRAVENOUS | Status: AC
  Filled 2015-06-06: qty 50

## 2015-06-06 MED ORDER — INSULIN ASPART 100 UNIT/ML ~~LOC~~ SOLN
10.0000 [IU] | Freq: Once | SUBCUTANEOUS | Status: AC
  Administered 2015-06-06: 10 [IU] via INTRAVENOUS

## 2015-06-06 NOTE — Progress Notes (Signed)
Subjective:  No recurrent chest pain; states breathing is better today and he looks more comfortable. Passed out when up to bedside commode last pm. States he passed some gas but did not strain. Monitor showed transient CHB with ventricular standstill. In NSR since then.   Objective:   Vital Signs : Filed Vitals:   06/06/15 0334 06/06/15 0400 06/06/15 0500 06/06/15 0700  BP:  117/94 119/94 88/67  Pulse:  82 82 88  Temp: 97.1 F (36.2 C)     TempSrc: Oral     Resp:  32 26 36  Height:      Weight:   121.1 kg (266 lb 15.6 oz)   SpO2:  99% 100% 99%    Intake/Output from previous day:  Intake/Output Summary (Last 24 hours) at 06/06/15 0732 Last data filed at 06/06/15 0631  Gross per 24 hour  Intake 1241.68 ml  Output      0 ml  Net 1241.68 ml    I/O since admission:  +907  Wt Readings from Last 3 Encounters:  06/06/15 121.1 kg (266 lb 15.6 oz)  06/30/14 125.556 kg (276 lb 12.8 oz)  04/11/13 124.286 kg (274 lb)    Medications: . aspirin EC  81 mg Oral Daily  . atorvastatin  80 mg Oral q1800  . ferrous sulfate  325 mg Oral BID WC  . sodium bicarbonate  1,300 mg Oral BID  . sodium chloride  3 mL Intravenous Q12H  . ticagrelor  90 mg Oral BID  . warfarin  7.5 mg Oral ONCE-1800  . warfarin   Does not apply Once  . Warfarin - Pharmacist Dosing Inpatient   Does not apply q1800    . heparin 2,100 Units/hr (06/06/15 0554)  . norepinephrine (LEVOPHED) Adult infusion 8 mcg/min (06/06/15 0408)  .  sodium bicarbonate 150 mEq in sterile water 1000 mL infusion Stopped (06/04/15 1200)    Physical Exam:   General appearance: alert, cooperative, obese Neck: no adenopathy, no carotid bruit, supple, symmetrical, trachea midline and thyroid not enlarged, symmetric, no tenderness/mass/nodules; JVD 7-8 cm Lungs: clear to auscultation bilaterally Heart: Tachycardic, IRR, 1/6 sem, no diastolic murmur; no rub Abdomen: soft, obese, non-tender; bowel sounds normal; no masses,  no  organomegaly Extremities: extremities normal, atraumatic, no cyanosis or edema and no edema, redness or tenderness in the calves or thighs Pulses: 2+ and symmetric Skin: Skin color, texture, turgor normal. No rashes or lesions Neurologic: Grossly normal   Rate: 123  Rhythm: atrial fibrillation  ECG (independently read by me):  NSR  With inferior infarct. Mild residual ST elevation 2,3, avf but much better. Q waves inferiorly.    Lab Results:   Recent Labs  06/04/15 0226 06/05/15 0500 06/06/15 0320  NA 131* 131* 132*  K 5.1 5.2* 7.4*  CL 99* 96* 93*  CO2 18* 22 19*  GLUCOSE 123* 100* 152*  BUN 75* 100* 131*  CREATININE 6.54* 8.62* 10.53*  CALCIUM 8.4* 8.4* 7.7*  PHOS  --  9.2*  --     Hepatic Function Latest Ref Rng 06/05/2015 05/16/2012  Total Protein 6.0 - 8.3 g/dL - 7.2  Albumin 3.5 - 5.0 g/dL 3.1(L) 4.5  AST 0 - 37 U/L - 19  ALT 0 - 53 U/L - 24  Alk Phosphatase 39 - 117 U/L - 62  Total Bilirubin 0.3 - 1.2 mg/dL - 0.6     Recent Labs  05/20/2015 0900  06/04/15 0226 06/05/15 0815 06/06/15 0320  WBC 21.0*  --  13.5* 19.3* 19.1*  NEUTROABS 17.2*  --   --   --   --   HGB 12.5*  < > 10.4* 12.0* 11.1*  HCT 39.9  < > 33.4* 36.9* 34.7*  MCV 67.2*  --  66.7* 65.1* 65.7*  PLT 178  --  138* 229 231  < > = values in this interval not displayed.   Recent Labs  05/20/2015 1546  TROPONINI >65.00*    Lab Results  Component Value Date   TSH 1.196 06/05/2015    Recent Labs  06/05/15 0815  HGBA1C 9.9*     Recent Labs  06/05/15 0500  ALBUMIN 3.1*    Recent Labs  06/06/15 0320  INR 1.85*   BNP (last 3 results)  Recent Labs  06/04/15 1136  BNP 679.9*    ProBNP (last 3 results) No results for input(s): PROBNP in the last 8760 hours.   Lipid Panel     Component Value Date/Time   CHOL 108 06/05/2015 0500   TRIG 121 06/05/2015 0500   HDL 27* 06/05/2015 0500   CHOLHDL 4.0 06/05/2015 0500   VLDL 24 06/05/2015 0500   LDLCALC 57 06/05/2015 0500          Imaging:  Ct Head Wo Contrast  06/05/2015   CLINICAL DATA:  Head injury after fall.  On Coumadin.  EXAM: CT HEAD WITHOUT CONTRAST  TECHNIQUE: Contiguous axial images were obtained from the base of the skull through the vertex without intravenous contrast.  COMPARISON:  None.  FINDINGS: Bony calvarium appears intact. No mass effect or midline shift is noted. Ventricular size is within normal limits. There is no evidence of hemorrhage. Focal low density is seen peripherally in the left cerebellar hemisphere most consistent with infarction of indeterminate age.  IMPRESSION: Focal low density seen peripherally in left cerebellar hemisphere concerning for infarction of indeterminate age. MRI is recommended for further evaluation.   Electronically Signed   By: Marijo Conception, M.D.   On: 06/05/2015 21:45   US Renal  06/04/2015   CLINICAL DATA:  Renal failure.  Initial encounter.  EXAM: RENAL / URINARY TRACT ULTRASOUND COMPLETE  COMPARISON:  None.  FINDINGS: Right Kidney:  Length: 12.3 cm. Echogenicity within normal limits. No mass or hydronephrosis visualized.  Left Kidney:  Length: 12.2 cm. Echogenicity within normal limits. No mass or hydronephrosis visualized.  Bladder:  Decompressed and poorly visualized.  IMPRESSION: Normal renal ultrasound. No evidence of hydronephrosis. Bladder poorly visualized.   Electronically Signed   By: Richardean Sale M.D.   On: 06/04/2015 20:54   Dg Chest Port 1 View  06/05/2015   CLINICAL DATA:  CHF  EXAM: PORTABLE CHEST - 1 VIEW  COMPARISON:  05/20/2015  FINDINGS: Moderate enlargement of the cardiomediastinal silhouette reidentified. Lungs are hypoaerated but clear. No pleural effusion. Cardiac leads overlie the chest. No acute osseous finding.  IMPRESSION: No acute cardiopulmonary process. Cardiomegaly without focal acute finding.   Electronically Signed   By: Conchita Paris M.D.   On: 06/05/2015 08:30    Echo: Study Conclusions  - Left ventricle: The  cavity size was normal. Systolic function was severely reduced. The estimated ejection fraction was in the range of 25% to 30%. Doppler parameters are consistent with elevated ventricular end-diastolic filling pressure. - Aortic valve: Trileaflet; normal thickness leaflets. There was no regurgitation. - Aortic root: The aortic root was normal in size. - Mitral valve: Calcified annulus. Mildly thickened leaflets . There was trivial regurgitation. - Left atrium: The atrium  was mildly dilated. - Right ventricle: The cavity size was normal. Wall thickness was normal. Systolic function was normal. - Tricuspid valve: There was mild regurgitation. - Pulmonary arteries: The main pulmonary artery was normal-sized. Systolic pressure was within the normal range. - Inferior vena cava: The vessel was normal in size. The respirophasic diameter changes were in the normal range (>= 50%), consistent with normal central venous pressure. - Pericardium, extracardiac: There was no pericardial effusion.  Impressions:  - There is diffuse hypokinesis with akinesis of the apical segments with an apical thrombus, overal LVEF is severely decreased estimated at 25-30%. A systemic anticoagulation is recommended.   Assessment/Plan:   Active Problems:   STEMI (ST elevation myocardial infarction)   Acute renal failure syndrome   ST elevation myocardial infarction (STEMI) involving right coronary artery with complication   Acute systolic CHF (congestive heart failure)   Anemia of renal disease   LV (left ventricular) mural thrombus following MI   Metabolic acidosis   Atrial fibrillation   Diabetes mellitus type 2 with complications   Hyperlipidemia   Hyposmolality and/or hyponatremia   Renal failure  1. STEMI: Probably initiated 3 days PTA with RCA being the culprit; suspect LCX and LAD occlusions are chronic.  S/P PTCA/thrombectomy, DES stenting from proximal to mid RCA with PTCA at  acute margin and distal RCA (could not pass stent through sharp angled acute margin).  Troponin>65 2. Severe 3 vessel CAD with also total mid-distal LAD occlusion and proximal LCx occlusion. On DAPT with ASA and Brilinta. Will need to add Coumadin due to Afib and LV thrombus. Consider switching Brilinta to Plavix prior to DC but I am hesitant to switch at this critical juncture.  3. Atrial fibrillation with RVR: converted to NSR after IV amiodarone bolus yesterday. Amiodarone later stopped due to hypotension.  On IV heparin. Will need long term anticoagulation with Coumadin. (not a NOAC candidate with RF). 4. Acute renal failure. BUN and creatinine rising. Potassium 7.4 today. More acidotic.Given IV calcium carbonate and insulin with D50. Kayexalate given. Plan placement of dialysis catheter today and initiate dialysis.  5. Acute systolic CHF with cardiogenic shock. EF 30%. Persistent hypotension yesterday improved with IV epinephrine.  6. Transient complete heart block possibly related to hyperkalemia. Hold beta blocker. Monitor on telemetry. CT head negative.  7. LV apical thrombus. Needs anticoagulation in addition to DAPT.  8. Microcytic anemia; prob contributed by renal disease but appears Fe deficienct. Will add po iron 9. Hyperlidemia: LDL 181, now on atorvastatin 80 mg 10. Type 2 DM 11. Obesity 12. Hyponatremia.  13. Metabolic acidosis. CO2 19. Due to ARF    Constant Mandeville Martinique MD, [facility]    06/06/2015, 7:32 AM

## 2015-06-06 NOTE — Progress Notes (Signed)
K 7.4 early AM, worsening renal fct. Have asked CCM for HD cath and will plan for HD after placement. Brookelynn Hamor C

## 2015-06-06 NOTE — Procedures (Signed)
Hemodialysis Insertion Procedure Note Joe Love [id number] 09-Jul-1961  Procedure: Insertion of Hemodialysis Catheter Type: 3 port  Indications: Hemodialysis   Procedure Details Consent: Risks of procedure as well as the alternatives and risks of each were explained to the (patient/caregiver).  Consent for procedure obtained. Time Out: Verified patient identification, verified procedure, site/side was marked, verified correct patient position, special equipment/implants available, medications/allergies/relevent history reviewed, required imaging and test results available.  Performed  Maximum sterile technique was used including antiseptics, cap, gloves, gown, hand hygiene, mask and sheet. Skin prep: Chlorhexidine; local anesthetic administered A antimicrobial bonded/coated triple lumen catheter was placed in the right internal jugular vein using the Seldinger technique. Ultrasound guidance used.Yes.   Catheter placed to 16 cm. Blood aspirated via all 3 ports and then flushed x 3. Line sutured x 2 and dressing applied.  Evaluation Blood flow good Complications: No apparent complications Patient did tolerate procedure well. Chest X-ray ordered to verify placement.  CXR: pending.  Joe Love PCCM Pager [phone] till 3 pm If no answer page [phone] 06/06/2015, 9:01 AM   Joe Fischeravid Tashanda Fuhrer, MD ; [facility] Mobile [phone].  After 5:30 PM or weekends, call [phone]

## 2015-06-06 NOTE — Progress Notes (Signed)
Pharmacist Heart Failure Core Measure Documentation  Assessment: Joe Love has an EF documented as 25-30% on 06/04/15 by Echo.  Rationale: Heart failure patients with left ventricular systolic dysfunction (LVSD) and an EF < 40% should be prescribed an angiotensin converting enzyme inhibitor (ACEI) or angiotensin receptor blocker (ARB) at discharge unless a contraindication is documented in the medical record.  This patient is not currently on an ACEI or ARB for HF.  This note is being placed in the record in order to provide documentation that a contraindication to the use of these agents is present for this encounter.  ACE Inhibitor or Angiotensin Receptor Blocker is contraindicated (specify all that apply)  []   ACEI allergy AND ARB allergy []   Angioedema []   Moderate or severe aortic stenosis []   Hyperkalemia []   Hypotension []   Renal artery stenosis [x]   Worsening renal function, preexisting renal disease or dysfunction   Harland Germanndrew Miro Balderson, Pharm D 06/06/2015 11:41 AM

## 2015-06-06 NOTE — Progress Notes (Signed)
Assessment:  1 Worsening Renal Failure, acute v chronic; exacerbated by hemodynamic insult and contrast.  Need to begin CRRT  2 Hyperkalemia, recurrent.  Need to begin CRRT  3 S/P IWMI s/p intervention, cardiogenic shock on pressor 4 Diabetes mellitus 5 Hypertension, poorly controlled in the past 6 Bradycardia ? Related to hyperkalemia  Subjective: Interval History: "fall" last PM assoc w/ low HR  Objective: Vital signs in last 24 hours: Temp:  [97.1 F (36.2 C)-98 F (36.7 C)] 97.1 F (36.2 C) (07/01 0334) Pulse Rate:  [38-158] 82 (07/01 0500) Resp:  [0-50] 26 (07/01 0500) BP: (42-158)/(23-126) 119/94 mmHg (07/01 0500) SpO2:  [71 %-100 %] 100 % (07/01 0500) Weight:  [121.1 kg (266 lb 15.6 oz)] 121.1 kg (266 lb 15.6 oz) (07/01 0500) Weight change: 0.987 kg (2 lb 2.8 oz)  Intake/Output from previous day: 06/30 0701 - 07/01 0700 In: 1241.7 [P.O.:200; I.V.:941.7; IV Piggyback:100] Out: -  Intake/Output this shift:    General appearance: alert and fatigued Resp: clear to auscultation bilaterally Chest wall: no tenderness Cardio: regular rate and rhythm, S1, S2 normal, no murmur, click, rub or gallop Extremities: extremities normal, atraumatic, no cyanosis or edema  Lab Results:  Recent Labs  06/05/15 0815 06/06/15 0320  WBC 19.3* 19.1*  HGB 12.0* 11.1*  HCT 36.9* 34.7*  PLT 229 231   BMET:  Recent Labs  06/05/15 0500 06/06/15 0320  NA 131* 132*  K 5.2* 7.4*  CL 96* 93*  CO2 22 19*  GLUCOSE 100* 152*  BUN 100* 131*  CREATININE 8.62* 10.53*  CALCIUM 8.4* 7.7*   No results for input(s): PTH in the last 72 hours. Iron Studies:  Recent Labs  06/04/15 1136 06/05/15 0815  IRON 9*  --   TIBC 354  --   FERRITIN  --  133   Studies/Results: Ct Head Wo Contrast  06/05/2015   CLINICAL DATA:  Head injury after fall.  On Coumadin.  EXAM: CT HEAD WITHOUT CONTRAST  TECHNIQUE: Contiguous axial images were obtained from the base of the skull through the vertex  without intravenous contrast.  COMPARISON:  None.  FINDINGS: Bony calvarium appears intact. No mass effect or midline shift is noted. Ventricular size is within normal limits. There is no evidence of hemorrhage. Focal low density is seen peripherally in the left cerebellar hemisphere most consistent with infarction of indeterminate age.  IMPRESSION: Focal low density seen peripherally in left cerebellar hemisphere concerning for infarction of indeterminate age. MRI is recommended for further evaluation.   Electronically Signed   By: Lupita RaiderJames  Green Jr, M.D.   On: 06/05/2015 21:45   [place] Renal  06/04/2015   CLINICAL DATA:  Renal failure.  Initial encounter.  EXAM: RENAL / URINARY TRACT ULTRASOUND COMPLETE  COMPARISON:  None.  FINDINGS: Right Kidney:  Length: 12.3 cm. Echogenicity within normal limits. No mass or hydronephrosis visualized.  Left Kidney:  Length: 12.2 cm. Echogenicity within normal limits. No mass or hydronephrosis visualized.  Bladder:  Decompressed and poorly visualized.  IMPRESSION: Normal renal ultrasound. No evidence of hydronephrosis. Bladder poorly visualized.   Electronically Signed   By: Carey BullocksWilliam  Veazey M.D.   On: 06/04/2015 20:54   Dg Chest Port 1 View  06/05/2015   CLINICAL DATA:  CHF  EXAM: PORTABLE CHEST - 1 VIEW  COMPARISON:  06/01/15  FINDINGS: Moderate enlargement of the cardiomediastinal silhouette reidentified. Lungs are hypoaerated but clear. No pleural effusion. Cardiac leads overlie the chest. No acute osseous finding.  IMPRESSION: No acute cardiopulmonary  process. Cardiomegaly without focal acute finding.   Electronically Signed   By: Christiana Pellant M.D.   On: 06/05/2015 08:30   Scheduled: . aspirin EC  81 mg Oral Daily  . atorvastatin  80 mg Oral q1800  . calcium gluconate  2 g Intravenous Once  . metoprolol tartrate  12.5 mg Oral 4 times per day  . sodium bicarbonate  1,300 mg Oral BID  . sodium chloride  3 mL Intravenous Q12H  . ticagrelor  90 mg Oral BID  .  warfarin  7.5 mg Oral ONCE-1800  . warfarin   Does not apply Once  . Warfarin - Pharmacist Dosing Inpatient   Does not apply q1800     LOS: 3 days   Joe Love C 06/06/2015,7:27 AM

## 2015-06-06 NOTE — Progress Notes (Signed)
Pt went back to Afib  With bp slightly low-Dr Hochrein made aware.

## 2015-06-06 NOTE — Progress Notes (Signed)
Per nursing staff patient had a large bloody stool. Melanotic and mostly blood. Currently on CVVH. Will stop IV heparin and hold Coumadin. On Protonix. Will ask GI to see.   Sava Proby Swazil[place] MD, [facility] CenterFACC  06/06/2015 4:26 PM

## 2015-06-06 NOTE — Progress Notes (Signed)
ANTICOAGULATION CONSULT NOTE - Follow Up Consult  Pharmacy Consult for heparin/coumadin Indication: afib, apical thrombus seen on ECHO  Allergies  Allergen Reactions  . Shrimp [Shellfish Allergy] Shortness Of Breath    Patient Measurements: Height: 5\' 11"  (180.3 cm) Weight: 264 lb 12.8 oz (120.112 kg) IBW/kg (Calculated) : 75.3 Heparin Dosing Weight: 100kg  Vital Signs: Temp: 97.1 F (36.2 C) (07/01 0334) Temp Source: Oral (07/01 0334) BP: 97/75 mmHg (07/01 0300) Pulse Rate: 84 (07/01 0300)  Labs:  Recent Labs  24-Oct-2015 0900 24-Oct-2015 1003 24-Oct-2015 1546 24-Oct-2015 2113 06/04/15 0226 06/05/15 0500 06/05/15 0815 06/05/15 1625 06/06/15 0320  HGB 12.5* 13.9  --   --  10.4*  --  12.0*  --   --   HCT 39.9 41.0  --   --  33.4*  --  36.9*  --   --   PLT 178  --   --   --  138*  --  229  --   --   LABPROT  --   --   --   --   --   --  17.7*  --  21.3*  INR  --   --   --   --   --   --  1.45  --  1.85*  HEPARINUNFRC  --   --   --   --   --   --  0.54 0.27* 0.15*  CREATININE 5.94* 4.30*  --  6.16* 6.54* 8.62*  --   --   --   TROPONINI  --   --  >65.00*  --   --   --   --   --   --     Estimated Creatinine Clearance: 12.9 mL/min (by C-G formula based on Cr of 8.62).  Assessment: 54 y.o. male presented with STEMI - s/p cath and stent 6/28. He is on heparin bridge to coumadin for afib and apical thrombus seen on ECHO. Heparin level 0.15 (subtherapeutic) on 1800 units/hr. Level actually went down after rate increase earlier. No issues with infusion or bleeding per RN. INR up to 1.85 past first dose of coumadin yesterday.   Goal of Therapy:  INR 2-3 Heparin level 0.3-0.7 units/ml Monitor platelets by anticoagulation protocol: Yes   Plan:  -Increase heparin to 2100 units/hr -F/u 8 hr heparin level -Daily heparin level and CBC -Coumadin 7.5mg  po again today -Daily PT/INR  Christoper Fabianaron Caidyn Henricksen, PharmD, BCPS Clinical pharmacist, pager [phone]   06/06/2015 5:12 AM

## 2015-06-06 NOTE — Consult Note (Signed)
Consult Note for Joe Love  Reason for Consult: Love bleed Referring Physician: Cardiology  Ebbie Ridge HPI: This is a 54 year old male admitted for an acute inferior STEMI who had a melenic bowel movement today.  He was admitted on 05/14/2015 with complaints of fatigue and indigestion that started 3 days prior to admission. At that time he was at a wedding.  His symptoms were intermittent over the intervening days, but on the day of admission he felt much worse.  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  A DES was placed from the proximal to mid RCA and his cardiac symptoms resolved.  He also suffered from afib with RVR with conversion to NSR with IV amiodarone.  There is also a LV apical thrombus.  Antiocoagulation with heparin and coumadin were administered and then he reported a melenic bowel movement.  His HGB dropped from 11.1 down to 8.8 g/dL.  His anticoagulation was held with the onset of the bleeding.  No prior colonoscopy and there is no history of Love bleed or PUD.  Past Medical History  Diagnosis Date  . Hypertension   . Hyperlipidemia   . Diabetes mellitus without complication     Past Surgical History  Procedure Laterality Date  . Cardiac catheterization N/A 05/30/2015    Procedure: Left Heart Cath and Coronary Angiography;  Surgeon: Lennette Bihari, MD;  Location: [facility] INVASIVE CV LAB;  Service: Cardiovascular;  Laterality: N/A;  . Cardiac catheterization N/A 06/02/2015    Procedure: Coronary Stent Intervention;  Surgeon: Lennette Bihari, MD;  Location: [facility] INVASIVE CV LAB;  Service: Cardiovascular;  Laterality: N/A;    Family History  Problem Relation Age of Onset  . Kidney disease Mother   . Hypertension Mother   . Diabetes Brother   . Coronary artery disease Mother   . Coronary artery disease Brother 77    Social History:  reports that he has never smoked. He does not have any smokeless tobacco history on  file. He reports that he drinks alcohol. He reports that he does not use illicit drugs.  Allergies:  Allergies  Allergen Reactions  . Shrimp [Shellfish Allergy] Shortness Of Breath    Medications:  Scheduled: . aspirin EC  81 mg Oral Daily  . atorvastatin  80 mg Oral q1800  . ferrous sulfate  325 mg Oral BID WC  . pantoprazole (PROTONIX) IV  40 mg Intravenous Q24H  . sodium bicarbonate  1,300 mg Oral BID  . ticagrelor  90 mg Oral BID  . warfarin   Does not apply Once   Continuous: . norepinephrine (LEVOPHED) Adult infusion 8 mcg/min (06/06/15 0700)  . dialysis replacement fluid (prismasate) 300 mL/hr at 06/06/15 1500  . dialysis replacement fluid (prismasate) 500 mL/hr at 06/06/15 1013  . dialysate (PRISMASATE) 2,000 mL/hr at 06/06/15 1800    Results for orders placed or performed during the hospital encounter of 06/05/2015 (from the past 24 hour(s))  CBC     Status: Abnormal   Collection Time: 06/06/15  3:20 AM  Result Value Ref Range   WBC 19.1 (H) 4.0 - 10.5 K/uL   RBC 5.28 4.22 - 5.81 MIL/uL   Hemoglobin 11.1 (L) 13.0 - 17.0 g/dL   HCT 16.1 (L) 09.6 - 04.5 %   MCV 65.7 (L) 78.0 - 100.0 fL   MCH 21.0 (L) 26.0 - 34.0 pg   MCHC 32.0 30.0 - 36.0 g/dL   RDW 40.9 (  H) 11.5 - 15.5 %   Platelets 231 150 - 400 K/uL  Basic metabolic panel     Status: Abnormal   Collection Time: 06/06/15  3:20 AM  Result Value Ref Range   Sodium 132 (L) 135 - 145 mmol/L   Potassium 7.4 (HH) 3.5 - 5.1 mmol/L   Chloride 93 (L) 101 - 111 mmol/L   CO2 19 (L) 22 - 32 mmol/L   Glucose, Bld 152 (H) 65 - 99 mg/dL   BUN 161 (H) 6 - 20 mg/dL   Creatinine, Ser 09.60 (H) 0.61 - 1.24 mg/dL   Calcium 7.7 (L) 8.9 - 10.3 mg/dL   GFR calc non Af Amer 5 (L) >60 mL/min   GFR calc Af Amer 6 (L) >60 mL/min   Anion gap 20 (H) 5 - 15  Protime-INR     Status: Abnormal   Collection Time: 06/06/15  3:20 AM  Result Value Ref Range   Prothrombin Time 21.3 (H) 11.6 - 15.2 seconds   INR 1.85 (H) 0.00 - 1.49  Heparin  level (unfractionated)     Status: Abnormal   Collection Time: 06/06/15  3:20 AM  Result Value Ref Range   Heparin Unfractionated 0.15 (L) 0.30 - 0.70 IU/mL  Renal function panel (daily at 1600)     Status: Abnormal   Collection Time: 06/06/15  4:00 PM  Result Value Ref Range   Sodium 132 (L) 135 - 145 mmol/L   Potassium 4.4 3.5 - 5.1 mmol/L   Chloride 97 (L) 101 - 111 mmol/L   CO2 22 22 - 32 mmol/L   Glucose, Bld 165 (H) 65 - 99 mg/dL   BUN 454 (H) 6 - 20 mg/dL   Creatinine, Ser 0.98 (H) 0.61 - 1.24 mg/dL   Calcium 6.6 (L) 8.9 - 10.3 mg/dL   Phosphorus 7.6 (H) 2.5 - 4.6 mg/dL   Albumin 2.7 (L) 3.5 - 5.0 g/dL   GFR calc non Af Amer 8 (L) >60 mL/min   GFR calc Af Amer 9 (L) >60 mL/min   Anion gap 13 5 - 15  CBC with Differential/Platelet     Status: Abnormal   Collection Time: 06/06/15  4:00 PM  Result Value Ref Range   WBC 15.2 (H) 4.0 - 10.5 K/uL   RBC 4.25 4.22 - 5.81 MIL/uL   Hemoglobin 8.8 (L) 13.0 - 17.0 g/dL   HCT 11.9 (L) 14.7 - 82.9 %   MCV 65.6 (L) 78.0 - 100.0 fL   MCH 20.7 (L) 26.0 - 34.0 pg   MCHC 31.5 30.0 - 36.0 g/dL   RDW 56.2 (H) 13.0 - 86.5 %   Platelets 211 150 - 400 K/uL   Neutrophils Relative % 83 (H) 43 - 77 %   Lymphocytes Relative 8 (L) 12 - 46 %   Monocytes Relative 8 3 - 12 %   Eosinophils Relative 1 0 - 5 %   Basophils Relative 0 0 - 1 %   Neutro Abs 12.6 (H) 1.7 - 7.7 K/uL   Lymphs Abs 1.2 0.7 - 4.0 K/uL   Monocytes Absolute 1.2 (H) 0.1 - 1.0 K/uL   Eosinophils Absolute 0.2 0.0 - 0.7 K/uL   Basophils Absolute 0.0 0.0 - 0.1 K/uL   RBC Morphology POLYCHROMASIA PRESENT    WBC Morphology ATYPICAL LYMPHOCYTES    Smear Review LARGE PLATELETS PRESENT      Ct Head Wo Contrast  06/05/2015   CLINICAL DATA:  Head injury after fall.  On Coumadin.  EXAM:  CT HEAD WITHOUT CONTRAST  TECHNIQUE: Contiguous axial images were obtained from the base of the skull through the vertex without intravenous contrast.  COMPARISON:  None.  FINDINGS: Bony calvarium appears  intact. No mass effect or midline shift is noted. Ventricular size is within normal limits. There is no evidence of hemorrhage. Focal low density is seen peripherally in the left cerebellar hemisphere most consistent with infarction of indeterminate age.  IMPRESSION: Focal low density seen peripherally in left cerebellar hemisphere concerning for infarction of indeterminate age. MRI is recommended for further evaluation.   Electronically Signed   By: Lupita RaiderJames  Green Jr, M.D.   On: 06/05/2015 21:45   [place] Renal  06/04/2015   CLINICAL DATA:  Renal failure.  Initial encounter.  EXAM: RENAL / URINARY TRACT ULTRASOUND COMPLETE  COMPARISON:  None.  FINDINGS: Right Kidney:  Length: 12.3 cm. Echogenicity within normal limits. No mass or hydronephrosis visualized.  Left Kidney:  Length: 12.2 cm. Echogenicity within normal limits. No mass or hydronephrosis visualized.  Bladder:  Decompressed and poorly visualized.  IMPRESSION: Normal renal ultrasound. No evidence of hydronephrosis. Bladder poorly visualized.   Electronically Signed   By: Carey BullocksWilliam  Veazey M.D.   On: 06/04/2015 20:54   Dg Chest Port 1 View  06/06/2015   CLINICAL DATA:  Respiratory failure. Line placement. Initial encounter.  EXAM: PORTABLE CHEST - 1 VIEW  COMPARISON:  06/05/2015 and 02-05-15.  FINDINGS: 0921 hr. New right IJ central venous catheter, projecting along the lateral aspect of the superior mediastinum at the level of the upper SVC. No pneumothorax. The heart size and mediastinal contours are stable. There are low lung volumes with minimal bibasilar atelectasis.  IMPRESSION: New central venous catheter projects over the lateral aspect of the superior mediastinum at the upper SVC level. No evidence of pneumothorax.   Electronically Signed   By: Carey BullocksWilliam  Veazey M.D.   On: 06/06/2015 09:30   Dg Chest Port 1 View  06/05/2015   CLINICAL DATA:  CHF  EXAM: PORTABLE CHEST - 1 VIEW  COMPARISON:  02-05-15  FINDINGS: Moderate enlargement of the  cardiomediastinal silhouette reidentified. Lungs are hypoaerated but clear. No pleural effusion. Cardiac leads overlie the chest. No acute osseous finding.  IMPRESSION: No acute cardiopulmonary process. Cardiomegaly without focal acute finding.   Electronically Signed   By: Christiana PellantGretchen  Green M.D.   On: 06/05/2015 08:30    ROS:  As stated above in the HPI otherwise negative.  Blood pressure 85/64, pulse 91, temperature 98.1 F (36.7 C), temperature source Oral, resp. rate 27, height 5\' 11"  (1.803 m), weight 121.1 kg (266 lb 15.6 oz), SpO2 99 %.    PE: Gen: NAD, Alert and Oriented HEENT:  [place]/AT, EOMI Neck: Supple, no LAD Lungs: CTA Bilaterally CV: RRR without M/G/R ABM: Soft, NTND, +BS Ext: No C/C/E  Assessment/Plan: 1) Love bleed. 2) STEMI s/p stenting. 3) Renal failure. 4) Obesity.   With his current bleeding, I will perform an EGD.  I discussed the case with Dr. Antoine PocheHochrein about the stability to perform an EGD and he is cleared for the procedure form a cardiac standpoint with moderate sedation.  Plan: 1) EGD tomorrow. 2) Follow HGB. 3) Transfuse as necessary. Vitalia Stough D 06/06/2015, 8:12 PM

## 2015-06-06 DEATH — deceased

## 2015-06-07 ENCOUNTER — Encounter (HOSPITAL_COMMUNITY): Payer: Self-pay | Admitting: *Deleted

## 2015-06-07 ENCOUNTER — Encounter (HOSPITAL_COMMUNITY): Admission: EM | Disposition: E | Payer: Self-pay | Source: Home / Self Care | Attending: Cardiovascular Disease

## 2015-06-07 DIAGNOSIS — I2129 ST elevation (STEMI) myocardial infarction involving other sites: Secondary | ICD-10-CM

## 2015-06-07 DIAGNOSIS — R570 Cardiogenic shock: Secondary | ICD-10-CM

## 2015-06-07 HISTORY — PX: ESOPHAGOGASTRODUODENOSCOPY: SHX5428

## 2015-06-07 LAB — RENAL FUNCTION PANEL
Albumin: 2.4 g/dL — ABNORMAL LOW (ref 3.5–5.0)
Anion gap: 8 (ref 5–15)
BUN: 57 mg/dL — ABNORMAL HIGH (ref 6–20)
CO2: 26 mmol/L (ref 22–32)
CREATININE: 3.69 mg/dL — AB (ref 0.61–1.24)
Calcium: 6.8 mg/dL — ABNORMAL LOW (ref 8.9–10.3)
Chloride: 100 mmol/L — ABNORMAL LOW (ref 101–111)
GFR calc Af Amer: 20 mL/min — ABNORMAL LOW (ref >60)
GFR, EST NON AFRICAN AMERICAN: 17 mL/min — AB (ref >60)
GLUCOSE: 256 mg/dL — AB (ref 65–99)
POTASSIUM: 3.8 mmol/L (ref 3.5–5.1)
Phosphorus: 4.6 mg/dL (ref 2.5–4.6)
Sodium: 134 mmol/L — ABNORMAL LOW (ref 135–145)

## 2015-06-07 LAB — BASIC METABOLIC PANEL
Anion gap: 11 (ref 5–15)
BUN: 77 mg/dL — ABNORMAL HIGH (ref 6–20)
CO2: 25 mmol/L (ref 22–32)
Calcium: 6.8 mg/dL — ABNORMAL LOW (ref 8.9–10.3)
Chloride: 99 mmol/L — ABNORMAL LOW (ref 101–111)
Creatinine, Ser: 4.43 mg/dL — ABNORMAL HIGH (ref 0.61–1.24)
GFR, EST AFRICAN AMERICAN: 16 mL/min — AB (ref >60)
GFR, EST NON AFRICAN AMERICAN: 14 mL/min — AB (ref >60)
Glucose, Bld: 136 mg/dL — ABNORMAL HIGH (ref 65–99)
Potassium: 3.3 mmol/L — ABNORMAL LOW (ref 3.5–5.1)
Sodium: 135 mmol/L (ref 135–145)

## 2015-06-07 LAB — ALBUMIN: ALBUMIN: 2.6 g/dL — AB (ref 3.5–5.0)

## 2015-06-07 LAB — CBC
HCT: 25.1 % — ABNORMAL LOW (ref 39.0–52.0)
Hemoglobin: 7.9 g/dL — ABNORMAL LOW (ref 13.0–17.0)
MCH: 20.7 pg — AB (ref 26.0–34.0)
MCHC: 31.5 g/dL (ref 30.0–36.0)
MCV: 65.9 fL — AB (ref 78.0–100.0)
Platelets: 197 10*3/uL (ref 150–400)
RBC: 3.81 MIL/uL — AB (ref 4.22–5.81)
RDW: 18.6 % — ABNORMAL HIGH (ref 11.5–15.5)
WBC: 13.4 10*3/uL — ABNORMAL HIGH (ref 4.0–10.5)

## 2015-06-07 LAB — PROTIME-INR
INR: 1.78 — ABNORMAL HIGH (ref 0.00–1.49)
Prothrombin Time: 20.7 seconds — ABNORMAL HIGH (ref 11.6–15.2)

## 2015-06-07 LAB — PHOSPHORUS: Phosphorus: 5.2 mg/dL — ABNORMAL HIGH (ref 2.5–4.6)

## 2015-06-07 LAB — MAGNESIUM: Magnesium: 2.2 mg/dL (ref 1.7–2.4)

## 2015-06-07 SURGERY — EGD (ESOPHAGOGASTRODUODENOSCOPY)
Anesthesia: Moderate Sedation

## 2015-06-07 MED ORDER — PRISMASOL BGK 4/2.5 32-4-2.5 MEQ/L IV SOLN
INTRAVENOUS | Status: DC
  Administered 2015-06-07 – 2015-06-08 (×4): via INTRAVENOUS_CENTRAL
  Filled 2015-06-07 (×5): qty 5000

## 2015-06-07 MED ORDER — POTASSIUM CHLORIDE 10 MEQ/50ML IV SOLN
10.0000 meq | INTRAVENOUS | Status: AC
  Administered 2015-06-07 (×4): 10 meq via INTRAVENOUS
  Filled 2015-06-07 (×2): qty 50

## 2015-06-07 MED ORDER — PANTOPRAZOLE SODIUM 40 MG IV SOLR
40.0000 mg | Freq: Two times a day (BID) | INTRAVENOUS | Status: DC
  Administered 2015-06-07 – 2015-06-11 (×9): 40 mg via INTRAVENOUS
  Filled 2015-06-07 (×11): qty 40

## 2015-06-07 MED ORDER — PRISMASOL BGK 4/2.5 32-4-2.5 MEQ/L IV SOLN
INTRAVENOUS | Status: DC
  Administered 2015-06-07 – 2015-06-08 (×2): via INTRAVENOUS_CENTRAL
  Filled 2015-06-07 (×3): qty 5000

## 2015-06-07 MED ORDER — FENTANYL CITRATE (PF) 100 MCG/2ML IJ SOLN
INTRAMUSCULAR | Status: AC
  Filled 2015-06-07: qty 4

## 2015-06-07 MED ORDER — MIDAZOLAM HCL 5 MG/ML IJ SOLN
INTRAMUSCULAR | Status: AC
  Filled 2015-06-07: qty 3

## 2015-06-07 MED ORDER — FENTANYL CITRATE (PF) 100 MCG/2ML IJ SOLN
INTRAMUSCULAR | Status: DC | PRN
  Administered 2015-06-07 (×2): 25 ug via INTRAVENOUS

## 2015-06-07 MED ORDER — SODIUM CHLORIDE 0.9 % IV SOLN: INTRAVENOUS | Status: DC

## 2015-06-07 MED ORDER — MIDAZOLAM HCL 10 MG/2ML IJ SOLN
INTRAMUSCULAR | Status: DC | PRN
  Administered 2015-06-07 (×2): 2 mg via INTRAVENOUS

## 2015-06-07 MED ORDER — DIPHENHYDRAMINE HCL 50 MG/ML IJ SOLN
INTRAMUSCULAR | Status: AC
  Filled 2015-06-07: qty 1

## 2015-06-07 MED ORDER — SODIUM CHLORIDE 0.9 % IJ SOLN
INTRAMUSCULAR | Status: DC | PRN
  Administered 2015-06-07: 2 mL

## 2015-06-07 MED ORDER — EPINEPHRINE HCL 0.1 MG/ML IJ SOSY
PREFILLED_SYRINGE | INTRAMUSCULAR | Status: AC
  Filled 2015-06-07: qty 10

## 2015-06-07 NOTE — Progress Notes (Signed)
Back in SR

## 2015-06-07 NOTE — Progress Notes (Signed)
Potassium 3.3-paged Dr Lowell GuitarPowell.

## 2015-06-07 NOTE — Op Note (Signed)
[facility] [address] [place], [id number]   ENDOSCOPY PROCEDURE REPORT  PATIENT: Joe Love, Joe Love  MR#: [id number] BIRTHDATE: January 09, 1961 , 53  yrs. old GENDER: male ENDOSCOPIST:Dewel Lotter Elnoria HowardHung, MD REFERRED BY: PROCEDURE DATE:  06/22/2015 PROCEDURE:   EGD w/ control of bleeding ASA CLASS:    Class IV INDICATIONS: Upper GI bleed MEDICATION: Versed 4 mg IV and Fentanyl 50 mcg IV TOPICAL ANESTHETIC:   Cetacaine Spray  DESCRIPTION OF PROCEDURE:   After the risks and benefits of the procedure were explained, informed consent was obtained.  The endoscope was introduced through the mouth  and advanced to the second portion of the duodenum .  The instrument was slowly withdrawn as the mucosa was fully examined. Estimated blood loss is zero unless otherwise noted in this procedure report.   FINDINGS: The esophagus was normal, no evidence of esophagitis.  In the gastric lumen gastritis was noted and in the antral region there were erosions.  No evidence of any active bleeding.  In the duodenal bulb multiple erosions and ulcerations were identified. at the transition point from D1 to D2 an ulcer with a visible vessel was identified.  Two ml of 1:10,000 Epi was injected on either side of the ulcer base.  Two hemoclips were secured adjacent and across the ulcer and a third was placed perpedicular across the base, which was more distal from the ulcer bed.  No bleeding was precipitated.  No active bleeding was identified, but there were a couple of suspicious ulcer that may have bled, however, there was no overt site for treatment.  The ulcerations extended in to the second portion of the duodenum.    Retroflexed views revealed no abnormalities.    The scope was then withdrawn from the patient and the procedure completed.  COMPLICATIONS: There were no immediate complications.  ENDOSCOPIC IMPRESSION: 1) Gastric erosions and gastritis. 2) Duodenal ulcers and  erosions. 3) Duodenal ulcer with visible vessel s/p hemoclipping. RECOMMENDATIONS:  1) Follow HGB and transfuse as necessary. 2) Continue with Brilinta as there was no evidence of active bleed. 3) Coumadin can be started in 3-5 days. 4) Increase Protonix to Q12 hours. 5) Check H. pylori status and treat if positive.   _______________________________ eSignedJeani Hawking:  Abhinav Mayorquin, MD 06/27/2015 1:12 PM     cc:  CPT CODES: ICD CODES:  The ICD and CPT codes recommended by this software are interpretations from the data that the clinical staff has captured with the software.  The verification of the translation of this report to the ICD and CPT codes and modifiers is the sole responsibility of the health care institution and practicing physician where this report was generated.  [employer] will not be held responsible for the validity of the ICD and CPT codes included on this report.  AMA assumes no liability for data contained or not contained herein. CPT is a Publishing rights managerregistered trademark of the [employer].  PATIENT NAME:  Joe Love, Joe Love MR#: [id number]

## 2015-06-07 NOTE — Progress Notes (Addendum)
1 Worsening Renal Failure, acute and chronic; exacerbated by hemodynamic insult and contrast. Continue CRRT and reduce UF goal.  Stop PO bicarb 2 Hyperkalemia, resolved. Now hypokalemic Continue  CRRT adjust prn, reduce UF rate and give KCL 3 S/P IWMI s/p intervention, cardiogenic shock on pressor 4 Diabetes mellitus 5 Hypertension, poorly controlled in the past 6 GIB  Subjective: Interval History: GI bleed  Objective: Vital signs in last 24 hours: Temp:  [96.8 F (36 Love)-98.1 F (36.7 Love)] 97.4 F (36.3 Love) (07/02 0732) Pulse Rate:  [48-112] 91 (07/02 0732) Resp:  [12-47] 26 (07/02 0732) BP: (73-108)/(35-85) 93/78 mmHg (07/02 0732) SpO2:  [96 %-100 %] 100 % (07/02 0732) Weight:  [119.2 kg (262 lb 12.6 oz)] 119.2 kg (262 lb 12.6 oz) (07/02 0458) Weight change: -1.9 kg (-4 lb 3 oz)  Intake/Output from previous day: 07/01 0701 - 07/02 0700 In: 1128.2 [P.O.:500; I.V.:578.2; IV Piggyback:50] Out: 3192 [Urine:1550; Stool:25] Intake/Output this shift: Total I/O In: -  Out: 66 [Other:66]  General appearance: alert and cooperative Head: Normocephalic, without obvious abnormality, atraumatic Resp: clear to auscultation bilaterally Extremities: extremities normal, atraumatic, no cyanosis or edema Cor RRR  Lab Results:  Recent Labs  06/06/15 2200 06/17/2015 0518  WBC 13.8* 13.4*  HGB 8.2* 7.9*  HCT 25.4* 25.1*  PLT 188 197   BMET:  Recent Labs  06/06/15 1600 07/02/2015 0518  NA 132* 135  K 4.4 3.3*  CL 97* 99*  CO2 22 25  GLUCOSE 165* 136*  BUN 110* 77*  CREATININE 7.11* 4.43*  CALCIUM 6.6* 6.8*   No results for input(s): PTH in the last 72 hours. Iron Studies:  Recent Labs  06/04/15 1136 06/05/15 0815  IRON 9*  --   TIBC 354  --   FERRITIN  --  133   Studies/Results: Ct Head Wo Contrast  06/05/2015   CLINICAL DATA:  Head injury after fall.  On Coumadin.  EXAM: CT HEAD WITHOUT CONTRAST  TECHNIQUE: Contiguous axial images were obtained from the base of the  skull through the vertex without intravenous contrast.  COMPARISON:  None.  FINDINGS: Bony calvarium appears intact. No mass effect or midline shift is noted. Ventricular size is within normal limits. There is no evidence of hemorrhage. Focal low density is seen peripherally in the left cerebellar hemisphere most consistent with infarction of indeterminate age.  IMPRESSION: Focal low density seen peripherally in left cerebellar hemisphere concerning for infarction of indeterminate age. MRI is recommended for further evaluation.   Electronically Signed   By: Lupita Raider, M.D.   On: 06/05/2015 21:45   Dg Chest Port 1 View  06/06/2015   CLINICAL DATA:  Respiratory failure. Line placement. Initial encounter.  EXAM: PORTABLE CHEST - 1 VIEW  COMPARISON:  06/05/2015 and 05/29/2015.  FINDINGS: 0921 hr. New right IJ central venous catheter, projecting along the lateral aspect of the superior mediastinum at the level of the upper SVC. No pneumothorax. The heart size and mediastinal contours are stable. There are low lung volumes with minimal bibasilar atelectasis.  IMPRESSION: New central venous catheter projects over the lateral aspect of the superior mediastinum at the upper SVC level. No evidence of pneumothorax.   Electronically Signed   By: Carey Bullocks M.D.   On: 06/06/2015 09:30   Scheduled: . aspirin EC  81 mg Oral Daily  . atorvastatin  80 mg Oral q1800  . ferrous sulfate  325 mg Oral BID WC  . pantoprazole (PROTONIX) IV  40 mg Intravenous  Q24H  . potassium chloride  10 mEq Intravenous Q1 Hr x 4  . sodium bicarbonate  1,300 mg Oral BID  . ticagrelor  90 mg Oral BID  . warfarin   Does not apply Once     LOS: 4 days   Joe Love 07/01/2015,9:03 AM

## 2015-06-07 NOTE — Progress Notes (Signed)
Patient Name: Joe Love Date of Encounter: 06/26/2015  Active Problems:   STEMI (ST elevation myocardial infarction)   Acute renal failure syndrome   ST elevation myocardial infarction (STEMI) involving right coronary artery with complication   Acute systolic CHF (congestive heart failure)   Anemia of renal disease   LV (left ventricular) mural thrombus following MI   Metabolic acidosis   Atrial fibrillation   Diabetes mellitus type 2 with complications   Hyperlipidemia   Hyposmolality and/or hyponatremia   Renal failure   Length of Stay: 4  SUBJECTIVE  Denies angina and dyspnea lying flat in bed. No further melena. Minimal drop in Hgb last 24h Remains hypotensive, on IV norepi, 3-hour episode of atrial fibrillation was poorly tolerated last night, but resolved spontaneously. Improving urine output 1.5L clear urine last 24h. Still on CRRT  CURRENT MEDS . aspirin EC  81 mg Oral Daily  . atorvastatin  80 mg Oral q1800  . ferrous sulfate  325 mg Oral BID WC  . pantoprazole (PROTONIX) IV  40 mg Intravenous Q24H  . potassium chloride  10 mEq Intravenous Q1 Hr x 4  . sodium bicarbonate  1,300 mg Oral BID  . ticagrelor  90 mg Oral BID  . warfarin   Does not apply Once    OBJECTIVE   Intake/Output Summary (Last 24 hours) at 06/26/2015 0816 Last data filed at 06/14/2015 0800  Gross per 24 hour  Intake  960.7 ml  Output   3008 ml  Net -2047.3 ml   Filed Weights   06/05/15 0400 06/06/15 0500 06/19/2015 0458  Weight: 264 lb 12.8 oz (120.112 kg) 266 lb 15.6 oz (121.1 kg) 262 lb 12.6 oz (119.2 kg)    PHYSICAL EXAM Filed Vitals:   06/19/2015 0607 06/14/2015 0630 06/09/2015 0700 06/28/2015 0732  BP: 102/64  81/63 93/78  Pulse: 91 88 86 91  Temp:    97.4 F (36.3 C)  TempSrc:    Oral  Resp: 30 22 34 26  Height:      Weight:      SpO2: 96% 100% 100% 100%   General: Alert, oriented x3, no distress Head: no evidence of trauma, PERRL, EOMI, no exophtalmos or lid lag, no myxedema,  no xanthelasma; normal ears, nose and oropharynx Neck: obesity obscures jugular venous pulsations and no hepatojugular reflux; brisk carotid pulses without delay and no carotid bruits Chest: clear to auscultation, no signs of consolidation by percussion or palpation, normal fremitus, symmetrical and full respiratory excursions Cardiovascular: unable to identify the apical impulse, regular rhythm, normal first and second heart sounds, no rubs or gallops, no murmur Abdomen: no tenderness or distention, no masses by palpation, no abnormal pulsatility or arterial bruits, normal bowel sounds, no hepatosplenomegaly Extremities: no clubbing, cyanosis or edema; 2+ radial, ulnar and brachial pulses bilaterally; 2+ right femoral, posterior tibial and dorsalis pedis pulses; 2+ left femoral, posterior tibial and dorsalis pedis pulses; no subclavian or femoral bruits Neurological: grossly nonfocal  LABS  CBC  Recent Labs  06/06/15 1600 06/06/15 2200 06/18/2015 0518  WBC 15.2* 13.8* 13.4*  NEUTROABS 12.6* 11.5*  --   HGB 8.8* 8.2* 7.9*  HCT 27.9* 25.4* 25.1*  MCV 65.6* 64.6* 65.9*  PLT 211 188 197   Basic Metabolic Panel  Recent Labs  06/06/15 1600 06/06/2015 0518  NA 132* 135  K 4.4 3.3*  CL 97* 99*  CO2 22 25  GLUCOSE 165* 136*  BUN 110* 77*  CREATININE 7.11* 4.43*  CALCIUM 6.6* 6.8*  MG  --  2.2  PHOS 7.6* 5.2*   Liver Function Tests  Recent Labs  06/06/15 1600 06/20/2015 0518  ALBUMIN 2.7* 2.6*   No results for input(s): LIPASE, AMYLASE in the last 72 hours. Cardiac Enzymes No results for input(s): CKTOTAL, CKMB, CKMBINDEX, TROPONINI in the last 72 hours. BNP Invalid input(s): POCBNP D-Dimer No results for input(s): DDIMER in the last 72 hours. Hemoglobin A1C  Recent Labs  06/05/15 0815  HGBA1C 9.9*   Fasting Lipid Panel  Recent Labs  06/05/15 0500  CHOL 108  HDL 27*  LDLCALC 57  TRIG 409  CHOLHDL 4.0   Thyroid Function Tests  Recent Labs  06/05/15 0800   TSH 1.196    Radiology Studies Imaging results have been reviewed and Ct Head Wo Contrast  06/05/2015   CLINICAL DATA:  Head injury after fall.  On Coumadin.  EXAM: CT HEAD WITHOUT CONTRAST  TECHNIQUE: Contiguous axial images were obtained from the base of the skull through the vertex without intravenous contrast.  COMPARISON:  None.  FINDINGS: Bony calvarium appears intact. No mass effect or midline shift is noted. Ventricular size is within normal limits. There is no evidence of hemorrhage. Focal low density is seen peripherally in the left cerebellar hemisphere most consistent with infarction of indeterminate age.  IMPRESSION: Focal low density seen peripherally in left cerebellar hemisphere concerning for infarction of indeterminate age. MRI is recommended for further evaluation.   Electronically Signed   By: Lupita Raider, M.D.   On: 06/05/2015 21:45   Dg Chest Port 1 View  06/06/2015   CLINICAL DATA:  Respiratory failure. Line placement. Initial encounter.  EXAM: PORTABLE CHEST - 1 VIEW  COMPARISON:  06/05/2015 and 05/28/2015.  FINDINGS: 0921 hr. New right IJ central venous catheter, projecting along the lateral aspect of the superior mediastinum at the level of the upper SVC. No pneumothorax. The heart size and mediastinal contours are stable. There are low lung volumes with minimal bibasilar atelectasis.  IMPRESSION: New central venous catheter projects over the lateral aspect of the superior mediastinum at the upper SVC level. No evidence of pneumothorax.   Electronically Signed   By: Carey Bullocks M.D.   On: 06/06/2015 09:30   Dg Chest Port 1 View  06/05/2015   CLINICAL DATA:  CHF  EXAM: PORTABLE CHEST - 1 VIEW  COMPARISON:  05/16/2015  FINDINGS: Moderate enlargement of the cardiomediastinal silhouette reidentified. Lungs are hypoaerated but clear. No pleural effusion. Cardiac leads overlie the chest. No acute osseous finding.  IMPRESSION: No acute cardiopulmonary process. Cardiomegaly  without focal acute finding.   Electronically Signed   By: Christiana Pellant M.D.   On: 06/05/2015 08:30    TELE NSR (afib last night, RVR 110-120))  ECG NSR, recent inferior MI  ASSESSMENT AND PLAN  1. STEMI: Probably initiated 3 days PTA with RCA being the culprit; suspect LCX and LAD occlusions are chronic. S/P PTCA/thrombectomy, DES stenting from proximal to mid RCA with PTCA at acute margin and distal RCA (could not pass stent through sharp angled acute margin). Troponin>65. Cardiogenic shock may in part be consequence of RV infarction, hopefully will soon see recovery 2. Severe 3 vessel CAD with also total mid-distal LAD occlusion and proximal LCx occlusion. On DAPT with ASA and Brilinta.  3. Atrial fibrillation with RVR: converted to NSR after IV amiodarone bolus yesterday. Amiodarone later stopped due to hypotension, restarted due to recurrent PAF.Anticoagulants on hold due to melena. Will need long term anticoagulation with  Coumadin. (not a NOAC candidate at this time with RF). 4. Acute renal failure. On CRRT, but with good UO. Hope to see recovery soon  5. Acute systolic CHF with cardiogenic shock. EF 25-30%. Persistent hypotension on IV norepinephrine.  6. Transient complete heart block possibly related to hyperkalemia. Hold beta blocker. Monitor on telemetry. CT head negative.  7. LV apical thrombus. Needs anticoagulation in addition to DAPT, after we clarify cause of GI bleeding.  8. Microcytic anemia: suspect preexisting GI loss, exacerbated by current treatment; on po iron 9. Hyperlidemia: LDL 181, now on atorvastatin 80 mg 10. Type 2 DM 11. Obesity   Thurmon Fair, MD, [facility] [phone] office [phone] pager 06/21/2015 8:16 AM

## 2015-06-08 DIAGNOSIS — R570 Cardiogenic shock: Secondary | ICD-10-CM

## 2015-06-08 LAB — RENAL FUNCTION PANEL
ALBUMIN: 2.3 g/dL — AB (ref 3.5–5.0)
Anion gap: 9 (ref 5–15)
BUN: 40 mg/dL — ABNORMAL HIGH (ref 6–20)
CALCIUM: 7.4 mg/dL — AB (ref 8.9–10.3)
CHLORIDE: 101 mmol/L (ref 101–111)
CO2: 26 mmol/L (ref 22–32)
CREATININE: 2.59 mg/dL — AB (ref 0.61–1.24)
GFR calc Af Amer: 31 mL/min — ABNORMAL LOW (ref >60)
GFR calc non Af Amer: 26 mL/min — ABNORMAL LOW (ref >60)
Glucose, Bld: 175 mg/dL — ABNORMAL HIGH (ref 65–99)
Phosphorus: 3.4 mg/dL (ref 2.5–4.6)
Potassium: 3.8 mmol/L (ref 3.5–5.1)
Sodium: 136 mmol/L (ref 135–145)

## 2015-06-08 LAB — CBC
HCT: 23.9 % — ABNORMAL LOW (ref 39.0–52.0)
HEMOGLOBIN: 7.3 g/dL — AB (ref 13.0–17.0)
MCH: 20.7 pg — ABNORMAL LOW (ref 26.0–34.0)
MCHC: 30.5 g/dL (ref 30.0–36.0)
MCV: 67.9 fL — ABNORMAL LOW (ref 78.0–100.0)
Platelets: 166 10*3/uL (ref 150–400)
RBC: 3.52 MIL/uL — ABNORMAL LOW (ref 4.22–5.81)
RDW: 18.7 % — AB (ref 11.5–15.5)
WBC: 10.9 10*3/uL — ABNORMAL HIGH (ref 4.0–10.5)

## 2015-06-08 LAB — MAGNESIUM: MAGNESIUM: 2.3 mg/dL (ref 1.7–2.4)

## 2015-06-08 LAB — ABO/RH: ABO/RH(D): O POS

## 2015-06-08 LAB — BASIC METABOLIC PANEL
Anion gap: 8 (ref 5–15)
BUN: 34 mg/dL — ABNORMAL HIGH (ref 6–20)
CO2: 29 mmol/L (ref 22–32)
CREATININE: 2.65 mg/dL — AB (ref 0.61–1.24)
Calcium: 7.3 mg/dL — ABNORMAL LOW (ref 8.9–10.3)
Chloride: 101 mmol/L (ref 101–111)
GFR calc non Af Amer: 26 mL/min — ABNORMAL LOW (ref >60)
GFR, EST AFRICAN AMERICAN: 30 mL/min — AB (ref >60)
GLUCOSE: 131 mg/dL — AB (ref 65–99)
Potassium: 3.4 mmol/L — ABNORMAL LOW (ref 3.5–5.1)
SODIUM: 138 mmol/L (ref 135–145)

## 2015-06-08 LAB — ALBUMIN: ALBUMIN: 2.4 g/dL — AB (ref 3.5–5.0)

## 2015-06-08 LAB — PREPARE RBC (CROSSMATCH)

## 2015-06-08 LAB — PHOSPHORUS: PHOSPHORUS: 3.8 mg/dL (ref 2.5–4.6)

## 2015-06-08 LAB — PROTIME-INR
INR: 1.52 — ABNORMAL HIGH (ref 0.00–1.49)
PROTHROMBIN TIME: 18.4 s — AB (ref 11.6–15.2)

## 2015-06-08 MED ORDER — SODIUM CHLORIDE 0.9 % IV SOLN
Freq: Once | INTRAVENOUS | Status: AC
  Administered 2015-06-08: 10 mL/h via INTRAVENOUS

## 2015-06-08 MED ORDER — METOPROLOL TARTRATE 25 MG PO TABS
25.0000 mg | ORAL_TABLET | Freq: Two times a day (BID) | ORAL | Status: DC
  Administered 2015-06-08 – 2015-06-09 (×2): 25 mg via ORAL
  Filled 2015-06-08 (×3): qty 1

## 2015-06-08 MED ORDER — METOPROLOL TARTRATE 1 MG/ML IV SOLN
INTRAVENOUS | Status: AC
  Administered 2015-06-08: 5 mg via INTRAVENOUS
  Filled 2015-06-08: qty 5

## 2015-06-08 MED ORDER — METOPROLOL TARTRATE 1 MG/ML IV SOLN
5.0000 mg | Freq: Once | INTRAVENOUS | Status: AC
  Administered 2015-06-08: 5 mg via INTRAVENOUS

## 2015-06-08 NOTE — Progress Notes (Signed)
Subjective: Feeling better.  Objective: Vital signs in last 24 hours: Temp:  [97.5 F (36.4 C)-98.6 F (37 C)] 98.5 F (36.9 C) (07/03 0400) Pulse Rate:  [83-103] 94 (07/03 0700) Resp:  [7-37] 23 (07/03 0700) BP: (78-119)/(59-81) 102/81 mmHg (07/03 0700) SpO2:  [99 %-100 %] 99 % (07/03 0700) Weight:  [114.5 kg (252 lb 6.8 oz)] 114.5 kg (252 lb 6.8 oz) (07/03 0500) Last BM Date: 06/23/2015  Intake/Output from previous day: 07/02 0701 - 07/03 0700 In: 1338.6 [P.O.:880; I.V.:308.6; IV Piggyback:150] Out: 4044 [Urine:1965] Intake/Output this shift:    General appearance: alert and no distress GI: soft, non-tender; bowel sounds normal; no masses,  no organomegaly  Lab Results:  Recent Labs  06/06/15 2200 06/20/2015 0518 06/08/15 0410  WBC 13.8* 13.4* 10.9*  HGB 8.2* 7.9* 7.3*  HCT 25.4* 25.1* 23.9*  PLT 188 197 166   BMET  Recent Labs  07/03/2015 0518 07/06/2015 1746 06/08/15 0410  NA 135 134* 138  K 3.3* 3.8 3.4*  CL 99* 100* 101  CO2 GLUCOSE 136* 256* 131*  BUN 77* 57* 34*  CREATININE 4.43* 3.69* 2.65*  CALCIUM 6.8* 6.8* 7.3*   LFT  Recent Labs  06/08/15 0410  ALBUMIN 2.4*   PT/INR  Recent Labs  06/18/2015 0518 06/08/15 0410  LABPROT 20.7* 18.4*  INR 1.78* 1.52*   Hepatitis Panel No results for input(s): HEPBSAG, HCVAB, HEPAIGM, HEPBIGM in the last 72 hours. C-Diff No results for input(s): CDIFFTOX in the last 72 hours. Fecal Lactopherrin No results for input(s): FECLLACTOFRN in the last 72 hours.  Studies/Results: Dg Chest Port 1 View  06/06/2015   CLINICAL DATA:  Respiratory failure. Line placement. Initial encounter.  EXAM: PORTABLE CHEST - 1 VIEW  COMPARISON:  06/05/2015 and June 04, 2015.  FINDINGS: 0921 hr. New right IJ central venous catheter, projecting along the lateral aspect of the superior mediastinum at the level of the upper SVC. No pneumothorax. The heart size and mediastinal contours are stable. There are low lung volumes with  minimal bibasilar atelectasis.  IMPRESSION: New central venous catheter projects over the lateral aspect of the superior mediastinum at the upper SVC level. No evidence of pneumothorax.   Electronically Signed   By: Carey Bullocks M.D.   On: 06/06/2015 09:30    Medications:  Scheduled: . sodium chloride   Intravenous Once  . aspirin EC  81 mg Oral Daily  . atorvastatin  80 mg Oral q1800  . ferrous sulfate  325 mg Oral BID WC  . pantoprazole (PROTONIX) IV  40 mg Intravenous Q12H  . ticagrelor  90 mg Oral BID  . warfarin   Does not apply Once   Continuous: . norepinephrine (LEVOPHED) Adult infusion Stopped (07/05/2015 2300)  . dialysate (PRISMASATE) 2,000 mL/hr at 06/08/15 0728  . dialysis replacement fluid (prismasate) 300 mL/hr at 06/08/15 0024  . dialysis replacement fluid (prismasate) 500 mL/hr at 06/08/15 0346    Assessment/Plan: 1) Duodenal ulcers and one ulcer with a visible vessel s/p hemoclipping. 2) Gastric erosions and gastritis. 3) CAD s/p DES on Brilinta. 4) Apical thrombus.   His HGB has dropped down from 7.9 to 7.3 overnight, but no overt evidence of bleeding.  He may be equilibrating.  He is hemodynamically stable and his pressors have been held.    Plan: 1) PPI Q12 hours. 2) Await H. Pylori serology. 3) Advance diet. 4) Maintain Brilinta. 5) Restart coumadin in the near future, probably 3-4 more days.     LOS:  5 days   Loudon Krakow D 06/08/2015, 7:55 AM

## 2015-06-08 NOTE — Progress Notes (Signed)
Patient converted to a-fib at 1944, EKG done to confirm. BP stable, patient asymptomatic. Cardiology on call paged.

## 2015-06-08 NOTE — Progress Notes (Signed)
Metoprolol 5mg  IV administered at 2040 per Dr. York Spanielilley's orders. Order for scheduled PO metoprolol 25mg  Bid also placed.

## 2015-06-08 NOTE — Progress Notes (Signed)
1 Worsening Renal Failure, acute and chronic; exacerbated by hemodynamic insult and contrast. Good UOP. Will stop CRRT after blood transfusion today and see what his renal fct does 2 Hyperkalemia, resolved. Now hypokalemic  3 S/P IWMI s/p intervention, cardiogenic shock off pressors 4 Diabetes mellitus 5 Hypertension, poorly controlled in the past 6 GIB 7 Anemia, transfuse PRBCs   Objective: Vital signs in last 24 hours: Temp:  [97.5 F (36.4 C)-98.6 F (37 C)] 98.5 F (36.9 C) (07/03 0800) Pulse Rate:  [83-103] 95 (07/03 0900) Resp:  [8-37] 14 (07/03 0900) BP: (78-119)/(53-81) 113/53 mmHg (07/03 0900) SpO2:  [99 %-100 %] 100 % (07/03 0900) Weight:  [114.5 kg (252 lb 6.8 oz)] 114.5 kg (252 lb 6.8 oz) (07/03 0500) Weight change: -4.7 kg (-10 lb 5.8 oz)  Intake/Output from previous day: 07/02 0701 - 07/03 0700 In: 1338.6 [P.O.:880; I.V.:308.6; IV Piggyback:150] Out: 4044 [Urine:1965] Intake/Output this shift: Total I/O In: 60 [P.O.:60] Out: 370 [Urine:275; Other:95]  General appearance: alert and cooperative Resp: clear to auscultation bilaterally Chest wall: no tenderness Cardio: regular rate and rhythm, S1, S2 normal, no murmur, click, rub or gallop Extremities: extremities normal, atraumatic, no cyanosis or edema  Lab Results:  Recent Labs  06/16/2015 0518 06/08/15 0410  WBC 13.4* 10.9*  HGB 7.9* 7.3*  HCT 25.1* 23.9*  PLT 197 166   BMET:  Recent Labs  06/21/2015 1746 06/08/15 0410  NA 134* 138  K 3.8 3.4*  CL 100* 101  CO2 26 29  GLUCOSE 256* 131*  BUN 57* 34*  CREATININE 3.69* 2.65*  CALCIUM 6.8* 7.3*   No results for input(s): PTH in the last 72 hours. Iron Studies: No results for input(s): IRON, TIBC, TRANSFERRIN, FERRITIN in the last 72 hours. Studies/Results: Dg Chest Port 1 View  06/06/2015   CLINICAL DATA:  Respiratory failure. Line placement. Initial encounter.  EXAM: PORTABLE CHEST - 1 VIEW  COMPARISON:  06/05/2015 and 05/24/2015.  FINDINGS:  0921 hr. New right IJ central venous catheter, projecting along the lateral aspect of the superior mediastinum at the level of the upper SVC. No pneumothorax. The heart size and mediastinal contours are stable. There are low lung volumes with minimal bibasilar atelectasis.  IMPRESSION: New central venous catheter projects over the lateral aspect of the superior mediastinum at the upper SVC level. No evidence of pneumothorax.   Electronically Signed   By: Carey BullocksWilliam  Veazey M.D.   On: 06/06/2015 09:30   Scheduled: . sodium chloride   Intravenous Once  . aspirin EC  81 mg Oral Daily  . atorvastatin  80 mg Oral q1800  . ferrous sulfate  325 mg Oral BID WC  . pantoprazole (PROTONIX) IV  40 mg Intravenous Q12H  . ticagrelor  90 mg Oral BID  . warfarin   Does not apply Once      LOS: 5 days   Krystofer Hevener C 06/08/2015,9:22 AM

## 2015-06-08 NOTE — Progress Notes (Signed)
Patient converted back to SR at 2133. Remains asymptomatic. BP stable. No complaints.

## 2015-06-08 NOTE — Progress Notes (Signed)
Patient Name: Joe Love Date of Encounter: 06/08/2015  Active Problems:   STEMI (ST elevation myocardial infarction)   Acute renal failure syndrome   ST elevation myocardial infarction (STEMI) involving right coronary artery with complication   Acute systolic CHF (congestive heart failure)   Anemia of renal disease   LV (left ventricular) mural thrombus following MI   Metabolic acidosis   Atrial fibrillation   Diabetes mellitus type 2 with complications   Hyperlipidemia   Hyposmolality and/or hyponatremia   Renal failure   Length of Stay: 5  SUBJECTIVE Many signs of improvement No angina. Breathes comfortably lying fully flat. No further AFib. Off pressors. UO almost 2L. Slow downward trend in Hgb. Now 7.3 EGD showed duodenal ulcer as likely cause of bleeding, but no active bleeding. Successfully pulling off >2L every 24h   CURRENT MEDS . sodium chloride   Intravenous Once  . aspirin EC  81 mg Oral Daily  . atorvastatin  80 mg Oral q1800  . ferrous sulfate  325 mg Oral BID WC  . pantoprazole (PROTONIX) IV  40 mg Intravenous Q12H  . ticagrelor  90 mg Oral BID  . warfarin   Does not apply Once    OBJECTIVE   Intake/Output Summary (Last 24 hours) at 06/08/15 0818 Last data filed at 06/08/15 0800  Gross per 24 hour  Intake 1329.2 ml  Output   4022 ml  Net -2692.8 ml   Filed Weights   06/06/15 0500 06/10/2015 0458 06/08/15 0500  Weight: 266 lb 15.6 oz (121.1 kg) 262 lb 12.6 oz (119.2 kg) 252 lb 6.8 oz (114.5 kg)    PHYSICAL EXAM Filed Vitals:   06/08/15 0400 06/08/15 0500 06/08/15 0600 06/08/15 0700  BP: 87/63 113/78 103/76 102/81  Pulse: 90 94 97 94  Temp: 98.5 F (36.9 C)     TempSrc: Oral     Resp: Height:      Weight:  252 lb 6.8 oz (114.5 kg)    SpO2: 100% 100% 100% 99%   General: Alert, oriented x3, no distress Head: no evidence of trauma, PERRL, EOMI, no exophtalmos or lid lag, no myxedema, no xanthelasma; normal ears, nose and  oropharynx Neck: normal jugular venous pulsations and no hepatojugular reflux; brisk carotid pulses without delay and no carotid bruits Chest: clear to auscultation, no signs of consolidation by percussion or palpation, normal fremitus, symmetrical and full respiratory excursions Cardiovascular: normal position and quality of the apical impulse, regular rhythm, normal first and second heart sounds, no rubs or gallops, no murmur Abdomen: no tenderness or distention, no masses by palpation, no abnormal pulsatility or arterial bruits, normal bowel sounds, no hepatosplenomegaly Extremities: no clubbing, cyanosis or edema; 2+ radial, ulnar and brachial pulses bilaterally; 2+ right femoral, posterior tibial and dorsalis pedis pulses; 2+ left femoral, posterior tibial and dorsalis pedis pulses; no subclavian or femoral bruits Neurological: grossly nonfocal  LABS  CBC  Recent Labs  06/06/15 1600 06/06/15 2200 07/02/2015 0518 06/08/15 0410  WBC 15.2* 13.8* 13.4* 10.9*  NEUTROABS 12.6* 11.5*  --   --   HGB 8.8* 8.2* 7.9* 7.3*  HCT 27.9* 25.4* 25.1* 23.9*  MCV 65.6* 64.6* 65.9* 67.9*  PLT 211 188 197 166   Basic Metabolic Panel  Recent Labs  07/04/2015 0518 06/06/2015 1746 06/08/15 0410  NA 135 134* 138  K 3.3* 3.8 3.4*  CL 99* 100* 101  CO2 GLUCOSE 136* 256* 131*  BUN 77* 57* 34*  CREATININE 4.43* 3.69* 2.65*  CALCIUM 6.8* 6.8* 7.3*  MG 2.2  --  2.3  PHOS 5.2* 4.6 3.8   Liver Function Tests  Recent Labs  Mar 07, 2015 1746 06/08/15 0410  ALBUMIN 2.4* 2.4*   Radiology Studies Imaging results have been reviewed and Dg Chest Port 1 View  06/06/2015   CLINICAL DATA:  Respiratory failure. Line placement. Initial encounter.  EXAM: PORTABLE CHEST - 1 VIEW  COMPARISON:  06/05/2015 and 06/01/2015.  FINDINGS: 0921 hr. New right IJ central venous catheter, projecting along the lateral aspect of the superior mediastinum at the level of the upper SVC. No pneumothorax. The heart size and  mediastinal contours are stable. There are low lung volumes with minimal bibasilar atelectasis.  IMPRESSION: New central venous catheter projects over the lateral aspect of the superior mediastinum at the upper SVC level. No evidence of pneumothorax.   Electronically Signed   By: Carey BullocksWilliam  Veazey M.D.   On: 06/06/2015 09:30    TELE NSR  ASSESSMENT AND PLAN   1. STEMI: Probably initiated 3 days PTA with RCA being the culprit; suspect LCX and LAD occlusions are chronic. S/P PTCA/thrombectomy, DES stenting from proximal to mid RCA with PTCA at acute margin and distal RCA (could not pass stent through sharp angled acute margin). Troponin>65. Cardiogenic shock may in part be consequence of RV infarction Starting to see recovery 2. Severe 3 vessel CAD with also total mid-distal LAD occlusion and proximal LCx occlusion. On DAPT with ASA and Brilinta, which were not stopped for GI bleed 3. Atrial fibrillation with RVR: converted to NSR after IV amiodarone bolus yesterday. Amiodarone later stopped due to hypotension, restarted due to recurrent PAF.Anticoagulants on hold due to melena. Will need long term anticoagulation with Coumadin. (not a NOAC candidate at this time with RF). 4. Acute renal failure. On CRRT, but with good UO. Hopefully will be ready to come off CRRT today or tomorrow 5. Acute systolic CHF with cardiogenic shock. EF 25-30%. Resolved hypotension now off IV norepinephrine.  6. Transient complete heart block possibly related to hyperkalemia. Hold beta blocker since BP still borderline low. Long term should not be a contraindication to beta blocker therapy for CAD and CHF. 7. LV apical thrombus. Needs anticoagulation in addition to DAPT, will start warfarin as discussed with Dr. Elnoria HowardHung in 3-4 days.  8. Microcytic anemia: suspect preexisting GI loss, exacerbated by current treatment and acute bleeding; on po iron. Transfusion today. 9. Hyperlidemia: LDL 181, now on atorvastatin 80 mg 10.  Type 2 DM 11. Obesity 12. Recent GI bleed due to duodenal ulcer - restart warfarin in 3-4 days, no heparin   Thurmon FairMihai Herson Prichard, MD, [facility] [phone] office [phone] pager 06/08/2015 8:18 AM

## 2015-06-09 ENCOUNTER — Inpatient Hospital Stay (HOSPITAL_COMMUNITY): Payer: Medicaid Other

## 2015-06-09 DIAGNOSIS — J9690 Respiratory failure, unspecified, unspecified whether with hypoxia or hypercapnia: Secondary | ICD-10-CM

## 2015-06-09 DIAGNOSIS — I2101 ST elevation (STEMI) myocardial infarction involving left main coronary artery: Secondary | ICD-10-CM

## 2015-06-09 DIAGNOSIS — N170 Acute kidney failure with tubular necrosis: Secondary | ICD-10-CM

## 2015-06-09 DIAGNOSIS — I5020 Unspecified systolic (congestive) heart failure: Secondary | ICD-10-CM

## 2015-06-09 DIAGNOSIS — I509 Heart failure, unspecified: Secondary | ICD-10-CM | POA: Insufficient documentation

## 2015-06-09 LAB — RENAL FUNCTION PANEL
ALBUMIN: 2.1 g/dL — AB (ref 3.5–5.0)
Albumin: 2.3 g/dL — ABNORMAL LOW (ref 3.5–5.0)
Albumin: 2.1 g/dL — ABNORMAL LOW (ref 3.5–5.0)
Anion gap: 10 (ref 5–15)
Anion gap: 9 (ref 5–15)
Anion gap: 14 (ref 5–15)
BUN: 57 mg/dL — AB (ref 6–20)
BUN: 82 mg/dL — AB (ref 6–20)
BUN: 91 mg/dL — ABNORMAL HIGH (ref 6–20)
CALCIUM: 7.4 mg/dL — AB (ref 8.9–10.3)
CHLORIDE: 97 mmol/L — AB (ref 101–111)
CHLORIDE: 100 mmol/L — AB (ref 101–111)
CO2: 26 mmol/L (ref 22–32)
CO2: 24 mmol/L (ref 22–32)
CO2: 19 mmol/L — ABNORMAL LOW (ref 22–32)
CREATININE: 3.12 mg/dL — AB (ref 0.61–1.24)
CREATININE: 3.15 mg/dL — AB (ref 0.61–1.24)
Calcium: 7.2 mg/dL — ABNORMAL LOW (ref 8.9–10.3)
Calcium: 7.4 mg/dL — ABNORMAL LOW (ref 8.9–10.3)
Chloride: 99 mmol/L — ABNORMAL LOW (ref 101–111)
Creatinine, Ser: 3.65 mg/dL — ABNORMAL HIGH (ref 0.61–1.24)
GFR calc Af Amer: 24 mL/min — ABNORMAL LOW (ref >60)
GFR calc Af Amer: 24 mL/min — ABNORMAL LOW (ref >60)
GFR calc non Af Amer: 21 mL/min — ABNORMAL LOW (ref >60)
GFR, EST AFRICAN AMERICAN: 20 mL/min — AB (ref >60)
GFR, EST NON AFRICAN AMERICAN: 21 mL/min — AB (ref >60)
GFR, EST NON AFRICAN AMERICAN: 17 mL/min — AB (ref >60)
GLUCOSE: 217 mg/dL — AB (ref 65–99)
Glucose, Bld: 209 mg/dL — ABNORMAL HIGH (ref 65–99)
Glucose, Bld: 275 mg/dL — ABNORMAL HIGH (ref 65–99)
PHOSPHORUS: 3.9 mg/dL (ref 2.5–4.6)
PHOSPHORUS: 5.2 mg/dL — AB (ref 2.5–4.6)
POTASSIUM: 3.8 mmol/L (ref 3.5–5.1)
POTASSIUM: 4.8 mmol/L (ref 3.5–5.1)
Phosphorus: 3.7 mg/dL (ref 2.5–4.6)
Potassium: 4.3 mmol/L (ref 3.5–5.1)
Sodium: 133 mmol/L — ABNORMAL LOW (ref 135–145)
Sodium: 133 mmol/L — ABNORMAL LOW (ref 135–145)
Sodium: 132 mmol/L — ABNORMAL LOW (ref 135–145)

## 2015-06-09 LAB — BASIC METABOLIC PANEL
ANION GAP: 10 (ref 5–15)
BUN: 58 mg/dL — AB (ref 6–20)
CALCIUM: 7.2 mg/dL — AB (ref 8.9–10.3)
CHLORIDE: 98 mmol/L — AB (ref 101–111)
CO2: 25 mmol/L (ref 22–32)
Creatinine, Ser: 3.14 mg/dL — ABNORMAL HIGH (ref 0.61–1.24)
GFR calc Af Amer: 24 mL/min — ABNORMAL LOW (ref >60)
GFR calc non Af Amer: 21 mL/min — ABNORMAL LOW (ref >60)
Glucose, Bld: 207 mg/dL — ABNORMAL HIGH (ref 65–99)
POTASSIUM: 3.7 mmol/L (ref 3.5–5.1)
Sodium: 133 mmol/L — ABNORMAL LOW (ref 135–145)

## 2015-06-09 LAB — CBC
HCT: 22.9 % — ABNORMAL LOW (ref 39.0–52.0)
HEMATOCRIT: 23.2 % — AB (ref 39.0–52.0)
HEMOGLOBIN: 7.4 g/dL — AB (ref 13.0–17.0)
Hemoglobin: 7.2 g/dL — ABNORMAL LOW (ref 13.0–17.0)
MCH: 22.7 pg — ABNORMAL LOW (ref 26.0–34.0)
MCH: 23.2 pg — AB (ref 26.0–34.0)
MCHC: 31.9 g/dL (ref 30.0–36.0)
MCHC: 31.4 g/dL (ref 30.0–36.0)
MCV: 71.2 fL — ABNORMAL LOW (ref 78.0–100.0)
MCV: 73.6 fL — AB (ref 78.0–100.0)
PLATELETS: 170 10*3/uL (ref 150–400)
Platelets: 241 10*3/uL (ref 150–400)
RBC: 3.26 MIL/uL — ABNORMAL LOW (ref 4.22–5.81)
RBC: 3.11 MIL/uL — ABNORMAL LOW (ref 4.22–5.81)
RDW: 19.8 % — ABNORMAL HIGH (ref 11.5–15.5)
RDW: 21.2 % — ABNORMAL HIGH (ref 11.5–15.5)
WBC: 12.4 10*3/uL — ABNORMAL HIGH (ref 4.0–10.5)
WBC: 23.9 10*3/uL — ABNORMAL HIGH (ref 4.0–10.5)

## 2015-06-09 LAB — CARBOXYHEMOGLOBIN
Carboxyhemoglobin: 1.6 % — ABNORMAL HIGH (ref 0.5–1.5)
Methemoglobin: 1.4 % (ref 0.0–1.5)
O2 Saturation: 46 %
Total hemoglobin: 7.1 g/dL — ABNORMAL LOW (ref 13.5–18.0)

## 2015-06-09 LAB — PROTIME-INR
INR: 1.43 (ref 0.00–1.49)
PROTHROMBIN TIME: 17.6 s — AB (ref 11.6–15.2)

## 2015-06-09 LAB — PREPARE RBC (CROSSMATCH)

## 2015-06-09 LAB — MAGNESIUM
MAGNESIUM: 2.3 mg/dL (ref 1.7–2.4)
Magnesium: 2.2 mg/dL (ref 1.7–2.4)

## 2015-06-09 LAB — LACTIC ACID, PLASMA: Lactic Acid, Venous: 4.9 mmol/L (ref 0.5–2.0)

## 2015-06-09 LAB — BRAIN NATRIURETIC PEPTIDE: B Natriuretic Peptide: 648.7 pg/mL — ABNORMAL HIGH (ref 0.0–100.0)

## 2015-06-09 MED ORDER — DOPAMINE-DEXTROSE 3.2-5 MG/ML-% IV SOLN
0.0000 ug/kg/min | INTRAVENOUS | Status: DC
  Administered 2015-06-09: 2.5 ug/kg/min via INTRAVENOUS
  Filled 2015-06-09: qty 250

## 2015-06-09 MED ORDER — AMIODARONE IV BOLUS ONLY 150 MG/100ML: 150.0000 mg | Freq: Once | INTRAVENOUS | Status: DC

## 2015-06-09 MED ORDER — POTASSIUM CHLORIDE CRYS ER 20 MEQ PO TBCR
40.0000 meq | EXTENDED_RELEASE_TABLET | Freq: Once | ORAL | Status: AC
  Administered 2015-06-09: 40 meq via ORAL
  Filled 2015-06-09: qty 2

## 2015-06-09 MED ORDER — FUROSEMIDE 10 MG/ML IJ SOLN
20.0000 mg | Freq: Two times a day (BID) | INTRAMUSCULAR | Status: DC
  Filled 2015-06-09: qty 2

## 2015-06-09 MED ORDER — FUROSEMIDE 10 MG/ML IJ SOLN
60.0000 mg | Freq: Once | INTRAMUSCULAR | Status: AC
  Administered 2015-06-09: 60 mg via INTRAVENOUS

## 2015-06-09 MED ORDER — AMIODARONE HCL IN DEXTROSE 360-4.14 MG/200ML-% IV SOLN
60.0000 mg/h | INTRAVENOUS | Status: AC
  Administered 2015-06-09: 60 mg/h via INTRAVENOUS
  Filled 2015-06-09: qty 200

## 2015-06-09 MED ORDER — AMIODARONE HCL IN DEXTROSE 360-4.14 MG/200ML-% IV SOLN
30.0000 mg/h | INTRAVENOUS | Status: DC
  Administered 2015-06-10 – 2015-06-11 (×3): 30 mg/h via INTRAVENOUS
  Filled 2015-06-09 (×12): qty 200

## 2015-06-09 MED ORDER — ACETAMINOPHEN 325 MG PO TABS
650.0000 mg | ORAL_TABLET | Freq: Once | ORAL | Status: AC
  Administered 2015-06-09: 650 mg via ORAL
  Filled 2015-06-09: qty 2

## 2015-06-09 MED ORDER — FUROSEMIDE 10 MG/ML IJ SOLN
INTRAMUSCULAR | Status: AC
  Filled 2015-06-09: qty 6

## 2015-06-09 MED ORDER — CARVEDILOL 3.125 MG PO TABS
3.1250 mg | ORAL_TABLET | Freq: Two times a day (BID) | ORAL | Status: DC
Start: 2015-06-09 — End: 2015-06-09
  Filled 2015-06-09 (×2): qty 1

## 2015-06-09 MED ORDER — SODIUM CHLORIDE 0.9 % IV SOLN
Freq: Once | INTRAVENOUS | Status: AC
  Administered 2015-06-09: via INTRAVENOUS

## 2015-06-09 MED ORDER — SODIUM CHLORIDE 0.9 % IV SOLN
Freq: Once | INTRAVENOUS | Status: AC
  Administered 2015-06-09: 10:00:00 via INTRAVENOUS

## 2015-06-09 MED ORDER — METOPROLOL TARTRATE 12.5 MG HALF TABLET
12.5000 mg | ORAL_TABLET | Freq: Three times a day (TID) | ORAL | Status: DC
  Filled 2015-06-09 (×5): qty 1

## 2015-06-09 MED ORDER — FUROSEMIDE 10 MG/ML IJ SOLN
INTRAMUSCULAR | Status: AC
  Filled 2015-06-09: qty 2

## 2015-06-09 MED ORDER — DIPHENHYDRAMINE HCL 25 MG PO CAPS
25.0000 mg | ORAL_CAPSULE | Freq: Once | ORAL | Status: AC
  Administered 2015-06-09: 25 mg via ORAL
  Filled 2015-06-09: qty 1

## 2015-06-09 MED ORDER — AMIODARONE LOAD VIA INFUSION
150.0000 mg | Freq: Once | INTRAVENOUS | Status: AC
  Administered 2015-06-09: 150 mg via INTRAVENOUS
  Filled 2015-06-09: qty 83.34

## 2015-06-09 MED ORDER — FUROSEMIDE 10 MG/ML IJ SOLN
20.0000 mg | Freq: Once | INTRAMUSCULAR | Status: AC
  Administered 2015-06-09: 20 mg via INTRAVENOUS

## 2015-06-09 MED ORDER — MAGNESIUM SULFATE 2 GM/50ML IV SOLN
2.0000 g | Freq: Once | INTRAVENOUS | Status: AC
  Administered 2015-06-09: 2 g via INTRAVENOUS
  Filled 2015-06-09: qty 50

## 2015-06-09 NOTE — Progress Notes (Signed)
Per RN MD Feinstien assessed Pt and advised against BiPAP at this time.  Pt stable on NRB.

## 2015-06-09 NOTE — Progress Notes (Signed)
Telemetry reviewed, NSVT increased in frequency and duration. Will try starting back IV amiodarone without bolus and see how his bp responds.   Dominga FerryJ Killian Schwer MD

## 2015-06-09 NOTE — Progress Notes (Signed)
On Call Cardiology   Code blue was called which was appropriately handled by the code team. He developed pulseless polymorphic VT treated with <1 min CPR and 120J and 150J shock and lidocaine bolus with ROSC. He denies any CP. +orthopnea. Still having PVC in a trigeminy pattern. On amio infusion at 60 mg/hr. Agree with slow transfusion of second unit of PRBC if Hb still low on repeat labs. Will start coreg low dose. Lasix. If Bp allows then will start vasodilator therapy. Appreciate CC consult.   Nevin BloodgoodAmir Rupa Lagan, MD

## 2015-06-09 NOTE — Progress Notes (Signed)
Patient Name: Joe Love Date of Encounter: 06/09/2015  Active Problems:   STEMI (ST elevation myocardial infarction)   Acute renal failure syndrome   ST elevation myocardial infarction (STEMI) involving right coronary artery with complication   Acute systolic CHF (congestive heart failure)   Anemia of renal disease   LV (left ventricular) mural thrombus following MI   Metabolic acidosis   Atrial fibrillation   Diabetes mellitus type 2 with complications   Hyperlipidemia   Hyposmolality and/or hyponatremia   Renal failure   Cardiogenic shock   Length of Stay: 6  SUBJECTIVE Continues to improveme No angina. Breathes comfortably lying fully flat. No melena.  No further AFib. Off pressors. UO almost 3L. Post unit of blood, Hg 7.3 >> 7.4 EGD showed duodenal ulcer as likely cause of bleeding, but no active bleeding.    CURRENT MEDS . aspirin EC  81 mg Oral Daily  . atorvastatin  80 mg Oral q1800  . ferrous sulfate  325 mg Oral BID WC  . metoprolol tartrate  25 mg Oral BID  . pantoprazole (PROTONIX) IV  40 mg Intravenous Q12H  . ticagrelor  90 mg Oral BID  . warfarin   Does not apply Once    OBJECTIVE   Intake/Output Summary (Last 24 hours) at 06/09/15 0840 Last data filed at 06/09/15 0600  Gross per 24 hour  Intake   1350 ml  Output   2998 ml  Net  -1648 ml   Filed Weights   2015/06/08 0458 06/08/15 0500 06/09/15 0401  Weight: 262 lb 12.6 oz (119.2 kg) 252 lb 6.8 oz (114.5 kg) 252 lb 13.9 oz (114.7 kg)    PHYSICAL EXAM Filed Vitals:   06/09/15 0400 06/09/15 0401 06/09/15 0500 06/09/15 0600  BP: 98/67  99/71 111/68  Pulse: 92  93 93  Temp: 98.7 F (37.1 C)     TempSrc: Oral     Resp: Height:      Weight:  252 lb 13.9 oz (114.7 kg)    SpO2: 99%  93% 100%   General: Alert, oriented x3, no distress Head: no evidence of trauma, PERRL, EOMI, no exophtalmos or lid lag, no myxedema, no xanthelasma; normal ears, nose and oropharynx Neck: normal  jugular venous pulsations and no hepatojugular reflux; brisk carotid pulses without delay and no carotid bruits Chest: clear to auscultation, no signs of consolidation by percussion or palpation, normal fremitus, symmetrical and full respiratory excursions Cardiovascular: normal position and quality of the apical impulse, regular rhythm, normal first and second heart sounds, no rubs or gallops, no murmur Abdomen: no tenderness or distention, no masses by palpation, no abnormal pulsatility or arterial bruits, normal bowel sounds, no hepatosplenomegaly Extremities: no clubbing, cyanosis or edema; 2+ radial, ulnar and brachial pulses bilaterally; 2+ right femoral, posterior tibial and dorsalis pedis pulses; 2+ left femoral, posterior tibial and dorsalis pedis pulses; no subclavian or femoral bruits Neurological: grossly nonfocal  LABS  CBC  Recent Labs  06/06/15 1600 06/06/15 2200  06/08/15 0410 06/09/15 0400  WBC 15.2* 13.8*  < > 10.9* 12.4*  NEUTROABS 12.6* 11.5*  --   --   --   HGB 8.8* 8.2*  < > 7.3* 7.4*  HCT 27.9* 25.4*  < > 23.9* 23.2*  MCV 65.6* 64.6*  < > 67.9* 71.2*  PLT 211 188  < > 166 170  < > = values in this interval not displayed. Basic Metabolic Panel  Recent Labs  06/08/15 0410 06/08/15  1713 06/09/15 0400  NA 138 136 133*  133*  K 3.4* 3.8 3.7  3.8  CL 101 101 98*  97*  CO2 29 26 25  26   GLUCOSE 131* 175* 207*  209*  BUN 34* 40* 58*  57*  CREATININE 2.65* 2.59* 3.14*  3.12*  CALCIUM 7.3* 7.4* 7.2*  7.2*  MG 2.3  --  2.2  PHOS 3.8 3.4 3.7   Liver Function Tests  Recent Labs  06/08/15 1713 06/09/15 0400  ALBUMIN 2.3* 2.3*   Radiology Studies Imaging results have been reviewed and No results found.  TELE NSR, PAT noted 140 BPM  ASSESSMENT AND PLAN   1. STEMI: Probably initiated 3 days PTA with RCA being the culprit; suspect LCX and LAD occlusions are chronic. S/P PTCA/thrombectomy, DES stenting from proximal to mid RCA with PTCA at acute  margin and distal RCA (could not pass stent through sharp angled acute margin). Troponin>65. Cardiogenic shock may in part be consequence of RV infarction Starting to see recovery 2. Severe 3 vessel CAD with also total mid-distal LAD occlusion and proximal LCx occlusion. On DAPT with ASA and Brilinta, which were not stopped for GI bleed 3. Atrial fibrillation with RVR: converted to NSR after IV amiodarone bolus yesterday. Amiodarone later stopped due to hypotension.Anticoagulants on hold due to melena. Will need long term anticoagulation with Coumadin. (not a NOAC candidate at this time with RF). Low dose Bb.  4. Acute renal failure. Post CRRT, but with good UO. Dr. Lowell GuitarPowell note reviewed. Creat increased however 5. Acute systolic CHF with cardiogenic shock. EF 25-30%. Resolved hypotension now off IV norepinephrine. Now on low dose Bb.  6. Transient complete heart block possibly related to hyperkalemia. Long term should not be a contraindication to beta blocker therapy for CAD and CHF, now doing well with low dose Bb.  7. LV apical thrombus. Needs anticoagulation in addition to DAPT, will start warfarin as discussed with Dr. Elnoria HowardHung in 2-3 days.  8. Microcytic anemia: suspect preexisting GI loss, exacerbated by current treatment and acute bleeding; on po iron. Post Transfusion Hg only increased from 7.3 to 7.4. Will give another unit of blood.  Benadryl and acetaminophen pre med.   9. Hyperlidemia: LDL 181, now on atorvastatin 80 mg 10. Type 2 DM 11. Obesity 12. Recent GI bleed due to duodenal ulcer - restart warfarin in 2-3 days, no heparin   Donato SchultzSKAINS, Joe Kidd, MD  [facility] 06/09/2015 8:40 AM

## 2015-06-09 NOTE — Progress Notes (Addendum)
On Call Cardiology  Called by RN regarding increasing frequency of NSVT. Seen by Dr. Wyline MoodBranch earlier this afternoon [facility].   He was admitted on 05/09/2015 with what sounds like delayed presentation of inf STEMI and had difficult  PCI to RCA which had 10% prox lesion, 80% mis lesion, 85% distal RCA and 80% ostial right PDA. He also significant obstructive CAD in multiple other regions (100% dLAD, 100% mCX,  100% OM2). Echo LVEf 25-30%, no significant valvular pathology. His PCI was complicated by AKI requiring HD via RIJ central venous catheter. Hs also developed GI bleeding requiring GI intervention. He also has LV apical thrombus.   He reports orthopnea. Denies any pain, N/V, bleeding. Poor appetite.   On exam, SBP 80-110, HR 60-90. Tele, frequent V ectopy and 4-6 beats NSVT.  JVD ~12 cm. No murmur. Normal S1 and S2. S3+.  Basal crackles Warm and perfused extremities.  Awake and alert but anxious   No current facility-administered medications on file prior to encounter.   Current Outpatient Prescriptions on File Prior to Encounter  Medication Sig Dispense Refill  . cloNIDine (CATAPRES) 0.2 MG tablet Take 1 tablet (0.2 mg total) by mouth 2 (two) times daily. 60 tablet 11  . metFORMIN (GLUCOPHAGE) 1000 MG tablet Take 1 tablet (1,000 mg total) by mouth daily. 30 tablet 11  . pravastatin (PRAVACHOL) 20 MG tablet 1 qhs (Patient taking differently: Take 20 mg by mouth at bedtime. ) 30 tablet 11  . triamterene-hydrochlorothiazide (MAXZIDE-25) 37.5-25 MG per tablet Take 1 tablet by mouth daily. 30 tablet 11    Inpatient:   . amiodarone  150 mg Intravenous Once  . aspirin EC  81 mg Oral Daily  . atorvastatin  80 mg Oral q1800  . ferrous sulfate  325 mg Oral BID WC  . furosemide      . metoprolol tartrate  25 mg Oral BID  . pantoprazole (PROTONIX) IV  40 mg Intravenous Q12H  . ticagrelor  90 mg Oral BID  . warfarin   Does not apply Once     A:  1.  NSVT in the setting severe multivessel CAD and ischemic cardiomyopathy, LVEF 25-30% and LV apical thrombus  - Normal K and Mg  - Recent inf STEMI s/p PCI to RCA on 6/28 - Symptoms and signs to suggest some HF exacerbation but no evidence of cardiogenic shock  - On Amiodarone gtt 60 mg/hr for the past 1-2 hours  - Coumadin was started for LV thrombus but on hold due to GI bleeding  - on DAPT (ASA and Brilinta)   P:  Will give Amio 150 mg IV bolus  Will check BNP, CXR, SVC-O2 saturation Lasix 20 mg IV x 1 I-O, daily weight Will hold Metoprolol this pm (he ot last dose 25 mg po this am)   Guarded prognosis   Nevin BloodgoodAmir Alyxis Grippi, MD Cardiology

## 2015-06-09 NOTE — Progress Notes (Signed)
Responded to Code [employer] page. Pt was having runs of V-tach. ROSC without need for intubation. Pt left on non-rebreather with appropriate mental status at that time.

## 2015-06-09 NOTE — Progress Notes (Signed)
Subjective: No acute events.  Feeling well.  Objective: Vital signs in last 24 hours: Temp:  [98.3 F (36.8 C)-99.7 F (37.6 C)] 98.7 F (37.1 C) (07/04 0400) Pulse Rate:  [46-141] 96 (07/04 0916) Resp:  [12-35] 13 (07/04 0800) BP: (78-118)/(37-74) 110/67 mmHg (07/04 0916) SpO2:  [93 %-100 %] 99 % (07/04 0800) Weight:  [114.7 kg (252 lb 13.9 oz)] 114.7 kg (252 lb 13.9 oz) (07/04 0401) Last BM Date: Nov 12, 2015  Intake/Output from previous day: 07/03 0701 - 07/04 0700 In: 1410 [P.O.:740; Blood:670] Out: 3042 [Urine:1925] Intake/Output this shift:    General appearance: alert and no distress GI: soft, non-tender; bowel sounds normal; no masses,  no organomegaly  Lab Results:  Recent Labs  Nov 12, 2015 0518 06/08/15 0410 06/09/15 0400  WBC 13.4* 10.9* 12.4*  HGB 7.9* 7.3* 7.4*  HCT 25.1* 23.9* 23.2*  PLT 197 166 170   BMET  Recent Labs  06/08/15 0410 06/08/15 1713 06/09/15 0400  NA 138 136 133*  133*  K 3.4* 3.8 3.7  3.8  CL 101 101 98*  97*  CO2 29 26 25  26   GLUCOSE 131* 175* 207*  209*  BUN 34* 40* 58*  57*  CREATININE 2.65* 2.59* 3.14*  3.12*  CALCIUM 7.3* 7.4* 7.2*  7.2*   LFT  Recent Labs  06/09/15 0400  ALBUMIN 2.3*   PT/INR  Recent Labs  06/08/15 0410 06/09/15 0400  LABPROT 18.4* 17.6*  INR 1.52* 1.43   Hepatitis Panel No results for input(s): HEPBSAG, HCVAB, HEPAIGM, HEPBIGM in the last 72 hours. C-Diff No results for input(s): CDIFFTOX in the last 72 hours. Fecal Lactopherrin No results for input(s): FECLLACTOFRN in the last 72 hours.  Studies/Results: No results found.  Medications:  Scheduled: . sodium chloride   Intravenous Once  . acetaminophen  650 mg Oral Once  . aspirin EC  81 mg Oral Daily  . atorvastatin  80 mg Oral q1800  . diphenhydrAMINE  25 mg Oral Once  . ferrous sulfate  325 mg Oral BID WC  . metoprolol tartrate  25 mg Oral BID  . pantoprazole (PROTONIX) IV  40 mg Intravenous Q12H  . ticagrelor  90 mg  Oral BID  . warfarin   Does not apply Once   Continuous: . norepinephrine (LEVOPHED) Adult infusion Stopped (Nov 12, 2015 2300)    Assessment/Plan: 1) Duodenal ulcers s/p hemoclipping of one ulcer with a visible vessel. 2) Gastric erosions and gastritis. 3) S/p STEMI. 4) LV apical thrombus. 5) Stable anemia. 6) ATN for cardiogenic shock.   Clinically he is stable.  H. Pylori Ab is still pending.  HGB is stable at 7.4 g/dL.    Plan: 1) Continue with PPI. 2) Continue with Brilinta. 3) Okay with coumadin. 4) Follow HGB and H. Pylori result.  LOS: 6 days   Affie Gasner D 06/09/2015, 9:28 AM

## 2015-06-09 NOTE — Progress Notes (Addendum)
eLink Physician-Brief Progress Note Patient Name: Joe Love DOB: Jan 14, 1961 MRN: [id number]   Date of Service  06/09/2015  HPI/Events of Note  Patient c/o dyspnea. On NRB. No t in distress on camera  eICU Interventions  Start bedside bipap Lasix 60mg  IV x 1 stat APP at bedside to evaluate      Intervention Category Intermediate Interventions: Respiratory distress - evaluation and management  Darron Stuck 06/09/2015, 9:36 PM

## 2015-06-09 NOTE — Progress Notes (Signed)
Patient ID: Joe Love, male   DOB: [birth year], 54 y.o.   MRN: [id number]  [facility] Progress Note    Assessment/ Plan:   1. AKI: Secondary to ATN following an STEMI/cardiogenic shock +/-CINP. With improving urine output and discontinuation of CRRT yesterday after blood transfusion given. Rise of creatinine noted but without indication for dialysis today. We'll continue to monitor for renal recovery given improvement of urine output. May have some residual renal injury/chronic kidney disease. 2. Status post STEMI with PTA/thrombectomy and DES stenting from proximal to mid RCA and cardiogenic shock thought to be from right ventricular infarction-now off pressors. 3. Atrial fibrillation with rapid ventricular response: Appears to be rate controlled on metoprolol for currently appears to be in sinus 4. Anemia: Recent gastrointestinal bleed secondary to DU-off anticoagulants at this time. Status post PRBC transfusion yesterday 5. LV apical thrombus: To resume anticoagulation when agreeable by gastroenterology  Subjective:   Reports to be feeling well-denies any complaints    Objective:   BP 111/68 mmHg  Pulse 93  Temp(Src) 98.7 F (37.1 C) (Oral)  Resp 12  Ht 5\' 11"  (1.803 m)  Wt 114.7 kg (252 lb 13.9 oz)  BMI 35.28 kg/m2  SpO2 100%  Intake/Output Summary (Last 24 hours) at 06/09/15 0843 Last data filed at 06/09/15 0600  Gross per 24 hour  Intake   1350 ml  Output   2998 ml  Net  -1648 ml   Weight change: 0.2 kg (7.1 oz)  Physical Exam: Gen: Comfortably resting in a recliner CVS: Pulse regular in rate and rhythm, S1 and S2 normal Resp: Clear to auscultation bilaterally-no rales Abd: Soft, obese, nontender Ext: No edema over lower extremities  Imaging: No results found.  Labs: BMET  Recent Labs Lab 06/05/15 0500 06/06/15 0320 06/06/15 1600 07/05/2015 0518 06/27/2015 1746 06/08/15 0410 06/08/15 1713 06/09/15 0400  NA 131* 132* 132* 135 134* 138 136  133*  133*  K 5.2* 7.4* 4.4 3.3* 3.8 3.4* 3.8 3.7  3.8  CL 96* 93* 97* 99* 100* 101 101 98*  97*  CO2 22 19* 22 25 26 29 26 25  26   GLUCOSE 100* 152* 165* 136* 256* 131* 175* 207*  209*  BUN 100* 131* 110* 77* 57* 34* 40* 58*  57*  CREATININE 8.62* 10.53* 7.11* 4.43* 3.69* 2.65* 2.59* 3.14*  3.12*  CALCIUM 8.4* 7.7* 6.6* 6.8* 6.8* 7.3* 7.4* 7.2*  7.2*  PHOS 9.2*  --  7.6* 5.2* 4.6 3.8 3.4 3.7   CBC  Recent Labs Lab 10/09/2015 0900  06/06/15 1600 06/06/15 2200 06/17/2015 0518 06/08/15 0410 06/09/15 0400  WBC 21.0*  < > 15.2* 13.8* 13.4* 10.9* 12.4*  NEUTROABS 17.2*  --  12.6* 11.5*  --   --   --   HGB 12.5*  < > 8.8* 8.2* 7.9* 7.3* 7.4*  HCT 39.9  < > 27.9* 25.4* 25.1* 23.9* 23.2*  MCV 67.2*  < > 65.6* 64.6* 65.9* 67.9* 71.2*  PLT 178  < > 211 188 197 166 170  < > = values in this interval not displayed.  Medications:    . aspirin EC  81 mg Oral Daily  . atorvastatin  80 mg Oral q1800  . ferrous sulfate  325 mg Oral BID WC  . metoprolol tartrate  25 mg Oral BID  . pantoprazole (PROTONIX) IV  40 mg Intravenous Q12H  . ticagrelor  90 mg Oral BID  . warfarin   Does not apply Once   Taylormarie Register  Allena Katz, MD 06/09/2015, 8:43 AM

## 2015-06-09 NOTE — Progress Notes (Signed)
eLink Physician-Brief Progress Note Patient Name: Joe Love DOB: January 20, 1961 MRN: [id number]   Date of Service  06/09/2015  HPI/Events of Note  Earlier eRN had noticed V tach runs. Bedside RN was instructed to inform cards. Cards since then intervened with med rx. But this appears refractory and patient needed brief CPR just now but has had ROSC with normal menation and resp drive on camera. RN says no immeidate needs for intubation    eICU Interventions  eMD will be standby for e-ICU itnerventions Will ask PCCM APP to place CVL if needed Monitor     Intervention Category Major Interventions: Other:  [name] 06/09/2015, 7:27 PM

## 2015-06-09 NOTE — Progress Notes (Signed)
Contacted by nursing staff regarding short 6 beat run of NSVT. Patient asymptomatic. K 3.7 this AM, Mg 2.2. Will give 40mEq of KCl to get K to 4. Had some transient heart block previously, soft bp's at times, will not increase lopressor at this time. NSVT related to his MI and low LVEF, if remains short runs would continue medical therapy.   Dominga FerryJ Ethelwyn Gilbertson MD

## 2015-06-09 NOTE — Progress Notes (Signed)
   06/09/15 2000  Clinical Encounter Type  Visited With Health care provider  Visit Type Code  Referral From Nurse  Consult/Referral To Chaplain  CH responded to Code Blue; No family present: CH available as needed for additional support.

## 2015-06-09 NOTE — Consult Note (Signed)
PULMONARY / CRITICAL CARE MEDICINE   Name: Joe Love MRN: [id number] DOB: 06-Jul-1961    ADMISSION DATE:  05/23/2015 CONSULTATION DATE:  06/09/2015  REFERRING MD :  Dr. Tresa Endo  CHIEF COMPLAINT:  VT arrest  INITIAL PRESENTATION: 54 year old male admitted for STEMI 6/28 with complicated cath and PCI. Hospital course complicated by cardiogenic shock (LVEF 25-30%), arrhythmia, AKI requiring HD, GI bleeding, and LV apical thrombus. 7/4 he was having episodes of NSVT, which eventually caused him to suffer a very brief VT arrest, after which he was awake and in no distress. PCCM consulted for further management.   STUDIES:  6/29 Echo > LVEF 25-30%, diffuse hypokinesis with akinesis of apical segments, with apical thrombus.   SIGNIFICANT EVENTS: 6/28 > admitted for STEMI, to cath lab, PCI > cardiogenic shock 7/1 > started HD for AKI s/p cath 7/3 > off pressors, CVVHD stopped 7/4 > very brief VT arrest  HISTORY OF PRESENT ILLNESS:  54 year old male with PMH as below, which includes HTN, HLD, and uncomplicated DM admitted for STEMI 6/28. Symptoms initially with onset 3 days PTA, which were described as indigestion while attending a wedding. He also had intermittent chest pain associated with nausea. This mostly resolved, but nausea and overall malaise still lingered so he presented to [facility] ED 6/28. In ED he was noted to have inferior ST segment elevations on EKG. He was taken emergently to cath lab, where he was found to have total occlusion of the mid distal LAD, total occlusion of the circumflex involving the OM 2 and distal circumflex and total occlusion of the RCA just beyond the ostium. RCA flow was successfully restored with a long arduous procedure including PTCA, thrombectomy, DES stenting of the ostium to the mid segment and PTCA of the distal RCA. Echo 6/29 found LV apical thrombus and severely reduced LVEF. He required epinephrine infusion. 7/1 he developed GI bleed and anticoagulation for LV  thrombus was dc'd. Also developed AF RVR which was responsive to chemical cardioversion with amio. He had been improving since that time with CRRT, 7/3 he was able to come off epi gtt and CRRT. 7/4 he developed some intermittent runs of NSVT, which evenutally caused him to suffer a very brief VT arrest. Post arrest he was awake and in no distress. PCCM consulted to assist with medical management.   PAST MEDICAL HISTORY :   has a past medical history of Hypertension; Hyperlipidemia; and Diabetes mellitus without complication.  has past surgical history that includes Cardiac catheterization (N/A, 05/25/2015) and Cardiac catheterization (N/A, 05/30/2015). Prior to Admission medications   Medication Sig Start Date End Date Taking? Authorizing Provider  cloNIDine (CATAPRES) 0.2 MG tablet Take 1 tablet (0.2 mg total) by mouth 2 (two) times daily. 06/30/14  Yes Elvina Sidle, MD  metFORMIN (GLUCOPHAGE) 1000 MG tablet Take 1 tablet (1,000 mg total) by mouth daily. 06/30/14  Yes Elvina Sidle, MD  pravastatin (PRAVACHOL) 20 MG tablet 1 qhs Patient taking differently: Take 20 mg by mouth at bedtime.  06/30/14  Yes Elvina Sidle, MD  triamterene-hydrochlorothiazide (MAXZIDE-25) 37.5-25 MG per tablet Take 1 tablet by mouth daily. 01/31/15   Elvina Sidle, MD   Allergies  Allergen Reactions  . Shrimp [Shellfish Allergy] Shortness Of Breath    FAMILY HISTORY:  indicated that his mother is deceased. He indicated that his brother is alive.  SOCIAL HISTORY:  reports that he has never smoked. He does not have any smokeless tobacco history on file. He reports that  he drinks alcohol. He reports that he does not use illicit drugs.  REVIEW OF SYSTEMS:   Bolds are positive  Constitutional: weight loss, gain, night sweats, Fevers, chills, fatigue .  HEENT: headaches, Sore throat, sneezing, nasal congestion, post nasal drip, Difficulty swallowing, Tooth/dental problems, visual complaints visual changes, ear  ache CV:  chest pain, radiates: ,Orthopnea, PND, swelling in lower extremities, dizziness, palpitations, syncope.  GI  heartburn, indigestion, abdominal pain, nausea, vomiting, diarrhea, change in bowel habits, loss of appetite, bloody stools.  Resp: cough, productive: , hemoptysis, dyspnea, chest pain, pleuritic.  Skin: rash or itching or icterus GU: dysuria, change in color of urine, urgency or frequency. flank pain, hematuria  MS: joint pain or swelling. decreased range of motion  Psych: change in mood or affect. depression or anxiety.  Neuro: difficulty with speech, weakness, numbness, ataxia    SUBJECTIVE:   VITAL SIGNS: Temp:  [98.2 F (36.8 C)-99.7 F (37.6 C)] 99.5 F (37.5 C) (07/04 1600) Pulse Rate:  [31-105] 31 (07/04 1900) Resp:  [12-36] 17 (07/04 1900) BP: (78-118)/(41-79) 118/41 mmHg (07/04 1900) SpO2:  [93 %-100 %] 100 % (07/04 1900) Weight:  [114.7 kg (252 lb 13.9 oz)] 114.7 kg (252 lb 13.9 oz) (07/04 0401) HEMODYNAMICS:   VENTILATOR SETTINGS:   INTAKE / OUTPUT:  Intake/Output Summary (Last 24 hours) at 06/09/15 2121 Last data filed at 06/09/15 1900  Gross per 24 hour  Intake 1272.73 ml  Output    700 ml  Net 572.73 ml    PHYSICAL EXAMINATION: General:  Obese male in NAD Neuro:  Alert, oriented, NAD HEENT:  /AT, no JVD noted, PERRL Cardiovascular:  RRR, no MRG Lungs:  Diminished bases Abdomen:  Soft, non-tender, non-distended Musculoskeletal:  No acute deformity, no edema Skin:  Grossly intact  LABS:  CBC  Recent Labs Lab 06/08/15 0410 06/09/15 0400 06/09/15 2004  WBC 10.9* 12.4* 23.9*  HGB 7.3* 7.4* 7.2*  HCT 23.9* 23.2* 22.9*  PLT 166 170 241   Coag's  Recent Labs Lab 07/04/2015 0518 06/08/15 0410 06/09/15 0400  INR 1.78* 1.52* 1.43   BMET  Recent Labs Lab 06/09/15 0400 06/09/15 1545 06/09/15 2004  NA 133*  133* 133* 132*  K 3.7  3.8 4.3 4.8  CL 98*  97* 100* 99*  CO2 25  26 24  19*  BUN 58*  57* 82* 91*   CREATININE 3.14*  3.12* 3.15* 3.65*  GLUCOSE 207*  209* 217* 275*   Electrolytes  Recent Labs Lab 06/08/15 0410  06/09/15 0400 06/09/15 1545 06/09/15 2004  CALCIUM 7.3*  < > 7.2*  7.2* 7.4* 7.4*  MG 2.3  --  2.2  --  2.3  PHOS 3.8  < > 3.7 3.9 5.2*  < > = values in this interval not displayed. Sepsis Markers  Recent Labs Lab 06/09/15 2013  LATICACIDVEN 4.9*   ABG No results for input(s): PHART, PCO2ART, PO2ART in the last 168 hours. Liver Enzymes  Recent Labs Lab 06/09/15 0400 06/09/15 1545 06/09/15 2004  ALBUMIN 2.3* 2.1* 2.1*   Cardiac Enzymes  Recent Labs Lab 05/28/2015 1546  TROPONINI >65.00*   Glucose  Recent Labs Lab 05/26/2015 0901 05/11/2015 1315 05/07/2015 1605 06/04/15 0746 06/04/15 1124 06/05/15 2009  GLUCAP 173* 146* 111* 103* 98 159*    Imaging Dg Chest Port 1 View  06/09/2015   CLINICAL DATA:  Patient has problems getting comfortable and could not sleep well last night. Orthopnea.  EXAM: PORTABLE CHEST - 1 VIEW  COMPARISON:  June 06, 2015  FINDINGS: The mediastinal contour is normal. The heart size is enlarged. There is no focal infiltrate, pulmonary edema, or pleural effusion. The visualized skeletal structures are unremarkable.  IMPRESSION: Cardiomegaly.  Lungs are clear.   Electronically Signed   By: Sherian Rein M.D.   On: 06/09/2015 20:53     ASSESSMENT / PLAN:  PULMONARY A: No acute issues  P:   Supplemental O2 as needed, currently on NRB, sats 100% PCXR  CARDIOVASCULAR HD cath RIJ 7/1 >>> LIJ CVL 7/4 >>> A:  S/p PCI for STEMI Severe 3 vessel CAD Cardiogenic shock > improved VT arrest AF - RVR Elevated lactic immediately post CPR and shocks  P:  Telemetry monitoring  Amio gtt May need pressors (Neo preferred) Place CVL Trend CVP goal 8-12 Cards primary, no lidocaine gtt due to poor EF Trend lactic  RENAL A:   AKI in setting STEMI with PCI and cardiogenic shock (off CRRT 7/3) Hyperkalemia > improved Mild  hyponatremia  P:   Renal following Repeat Bmet Correct electrolytes as indicated  GASTROINTESTINAL A:   GI bleed > apparently resolved  P:   Holding anticoagulation for now per Cards/GI NPO Protonix gtt GI follwoing  HEMATOLOGIC A:   Anemia, suspect acute blood loss 2nd to GI bleeding LV apical thrombus  P:  Stat CBC Transfuse per ICU guidelines On dual antiplatelet therapy Anticoag on hold in setting GIB  INFECTIOUS A:   Leukocytosis Low grade fever  P:   BCx2 7/4 >>> UC 7/4 >>> PCT algorithm  Monitor off ABX for now  ENDOCRINE A:   DM  P:   CBG monitoring and SSI  NEUROLOGIC A:   No acute issues  P:   RASS goal: 0 Monitor   FAMILY  - Updates:   - Inter-disciplinary family meet or Palliative Care meeting due by:  7/11    Joneen Roach, AGACNP-BC [place] [facility] Pager [phone] or [phone]  06/09/2015 10:16 PM   STAFF NOTE: Cindi Carbon, MD FACP have personally reviewed patient's available data, including medical history, events of note, physical examination and test results as part of my evaluation. I have discussed with resident/NP and other care providers such as pharmacist, RN and RRT. In addition, I personally evaluated patient and elicited key findings of: Awake alert, no distress currently, does not descriobe SOB or CP now, no distress, lungs without overt crackles, likely dopamine beta affect precipitated VT, amio to continue, send STAT bmet, mg, phos, low threshold empiric mag, unlikely amio cause hypotension prior, could bolus 150 further if ectopy continues, also I feel that lidocaine is an option short term for stabilization arrhythmia if needed, no overt pulm edema on pcxr, line placed, get cvp, if BP drops would use neo without any beta affect agonists, would hold further lasix, does not require intubation at this stage, updated pt in full, ecg post without ST changes, also went to waiting room, son not  available, trop per cards, foley needed The patient is critically ill with multiple organ systems failure and requires high complexity decision making for assessment and support, frequent evaluation and titration of therapies, application of advanced monitoring technologies and extensive interpretation of multiple databases.   Critical Care Time devoted to patient care services described in this note is 30 Minutes. This time reflects time of care of this signee: Rory Percy, MD FACP. This critical care time does not reflect procedure time, or teaching time or supervisory time of PA/NP/Med student/Med Resident etc  but could involve care discussion time. Rest per NP/medical resident whose note is outlined above and that I agree with   Mcarthur Rossettianiel J. Tyson AliasFeinstein, MD, FACP Pgr: [phone] [place] [facility] 06/09/2015 10:38 PM  '

## 2015-06-09 NOTE — Progress Notes (Signed)
1600: Pt's monitor alarmed for Vtach , about 6 beats non sustained. VSS. No discomfort noted. MD paged (Dr. Wyline MoodBranch) no new order.  1610: MD repaged re the frequency of the Vtach, MD stated He would come to see pt. Pt on unit to assess pt. New order received and carried out.  1715: Elink RN called to inform this RN re runs of VTach on monitor. [facility], stated to call Cardiology back re frequency of Vtach and get a 12 lead. 12 lead done and MD paged (Dr. Wyline MoodBranch) Stated he was [facility], to call the fellow. Fellow (Dr. Tarri GlennAzeem) paged.  1730: Dr Orlan LeavensAzeem  Came to see pt, new orders received and carried out.  1910: Pt's monitor alarmed for sustained VTach. Upon entering room, pt was in  Distress with eyes rolled back. Code blue called, see code sheet. Pt resting quietly. Will cont to monitor closely  1945: Attempt to reach pt's sister by phone futile, message left on voice mail. Endorsed to pm RN to follow up.

## 2015-06-09 NOTE — Procedures (Signed)
Central Venous Catheter Insertion Procedure Note Joe Love [id number] Mar 18, 1961  Procedure: Insertion of Central Venous Catheter Indications: Assessment of intravascular volume, Drug and/or fluid administration and Frequent blood sampling  Procedure Details Consent: Risks of procedure as well as the alternatives and risks of each were explained to the (patient/caregiver).  Consent for procedure obtained. Time Out: Verified patient identification, verified procedure, site/side was marked, verified correct patient position, special equipment/implants available, medications/allergies/relevent history reviewed, required imaging and test results available.  Performed  Maximum sterile technique was used including antiseptics, cap, gloves, gown, hand hygiene, mask and sheet. Skin prep: Chlorhexidine; local anesthetic administered A antimicrobial bonded/coated triple lumen catheter was placed in the left internal jugular vein using the Seldinger technique. Ultrasound guidance used.Yes.   Catheter placed to 20 cm. Blood aspirated via all 3 ports and then flushed x 3. Line sutured x 2 and dressing applied.  Evaluation Blood flow good Complications: No apparent complications Patient did tolerate procedure well. Chest X-ray ordered to verify placement.  CXR: pending.  Joe Love, AGACNP-BC [facility] Pager [phone] or [phone]  06/09/2015 10:38 PM

## 2015-06-10 ENCOUNTER — Inpatient Hospital Stay (HOSPITAL_COMMUNITY): Payer: Medicaid Other

## 2015-06-10 ENCOUNTER — Encounter (HOSPITAL_COMMUNITY): Payer: Self-pay | Admitting: Gastroenterology

## 2015-06-10 DIAGNOSIS — D72829 Elevated white blood cell count, unspecified: Secondary | ICD-10-CM

## 2015-06-10 DIAGNOSIS — K922 Gastrointestinal hemorrhage, unspecified: Secondary | ICD-10-CM

## 2015-06-10 DIAGNOSIS — I255 Ischemic cardiomyopathy: Secondary | ICD-10-CM

## 2015-06-10 DIAGNOSIS — I472 Ventricular tachycardia, unspecified: Secondary | ICD-10-CM

## 2015-06-10 DIAGNOSIS — E875 Hyperkalemia: Secondary | ICD-10-CM

## 2015-06-10 DIAGNOSIS — D62 Acute posthemorrhagic anemia: Secondary | ICD-10-CM | POA: Insufficient documentation

## 2015-06-10 DIAGNOSIS — K2970 Gastritis, unspecified, without bleeding: Secondary | ICD-10-CM

## 2015-06-10 DIAGNOSIS — I2510 Atherosclerotic heart disease of native coronary artery without angina pectoris: Secondary | ICD-10-CM

## 2015-06-10 DIAGNOSIS — K264 Chronic or unspecified duodenal ulcer with hemorrhage: Secondary | ICD-10-CM | POA: Insufficient documentation

## 2015-06-10 DIAGNOSIS — K269 Duodenal ulcer, unspecified as acute or chronic, without hemorrhage or perforation: Secondary | ICD-10-CM

## 2015-06-10 DIAGNOSIS — I480 Paroxysmal atrial fibrillation: Secondary | ICD-10-CM

## 2015-06-10 LAB — PROTIME-INR
INR: 1.65 — AB (ref 0.00–1.49)
Prothrombin Time: 19.5 seconds — ABNORMAL HIGH (ref 11.6–15.2)

## 2015-06-10 LAB — BASIC METABOLIC PANEL
Anion gap: 11 (ref 5–15)
BUN: 107 mg/dL — AB (ref 6–20)
CALCIUM: 7.4 mg/dL — AB (ref 8.9–10.3)
CO2: 22 mmol/L (ref 22–32)
CREATININE: 3.93 mg/dL — AB (ref 0.61–1.24)
Chloride: 98 mmol/L — ABNORMAL LOW (ref 101–111)
GFR calc Af Amer: 18 mL/min — ABNORMAL LOW (ref >60)
GFR calc non Af Amer: 16 mL/min — ABNORMAL LOW (ref >60)
Glucose, Bld: 304 mg/dL — ABNORMAL HIGH (ref 65–99)
Potassium: 5.1 mmol/L (ref 3.5–5.1)
Sodium: 131 mmol/L — ABNORMAL LOW (ref 135–145)

## 2015-06-10 LAB — RENAL FUNCTION PANEL
ANION GAP: 11 (ref 5–15)
Albumin: 2.2 g/dL — ABNORMAL LOW (ref 3.5–5.0)
BUN: 107 mg/dL — AB (ref 6–20)
CHLORIDE: 98 mmol/L — AB (ref 101–111)
CO2: 22 mmol/L (ref 22–32)
CREATININE: 3.89 mg/dL — AB (ref 0.61–1.24)
Calcium: 7.4 mg/dL — ABNORMAL LOW (ref 8.9–10.3)
GFR calc Af Amer: 19 mL/min — ABNORMAL LOW (ref >60)
GFR calc non Af Amer: 16 mL/min — ABNORMAL LOW (ref >60)
GLUCOSE: 303 mg/dL — AB (ref 65–99)
POTASSIUM: 5.1 mmol/L (ref 3.5–5.1)
Phosphorus: 5.5 mg/dL — ABNORMAL HIGH (ref 2.5–4.6)
Sodium: 131 mmol/L — ABNORMAL LOW (ref 135–145)

## 2015-06-10 LAB — BLOOD GAS, ARTERIAL
Acid-base deficit: 2.9 mmol/L — ABNORMAL HIGH (ref 0.0–2.0)
BICARBONATE: 20.9 meq/L (ref 20.0–24.0)
DRAWN BY: 422461
FIO2: 1 %
O2 Content: 15 L/min
O2 SAT: 99.8 %
PO2 ART: 408 mmHg — AB (ref 80.0–100.0)
Patient temperature: 97.7
TCO2: 21.9 mmol/L (ref 0–100)
pCO2 arterial: 32.2 mmHg — ABNORMAL LOW (ref 35.0–45.0)
pH, Arterial: 7.425 (ref 7.350–7.450)

## 2015-06-10 LAB — CBC
HCT: 24.1 % — ABNORMAL LOW (ref 39.0–52.0)
Hemoglobin: 8 g/dL — ABNORMAL LOW (ref 13.0–17.0)
MCH: 24.6 pg — ABNORMAL LOW (ref 26.0–34.0)
MCHC: 33.2 g/dL (ref 30.0–36.0)
MCV: 74.2 fL — ABNORMAL LOW (ref 78.0–100.0)
PLATELETS: 261 10*3/uL (ref 150–400)
RBC: 3.25 MIL/uL — ABNORMAL LOW (ref 4.22–5.81)
RDW: 21.2 % — ABNORMAL HIGH (ref 11.5–15.5)
WBC: 27.7 10*3/uL — AB (ref 4.0–10.5)

## 2015-06-10 LAB — TROPONIN I: TROPONIN I: 18.48 ng/mL — AB (ref <0.031)

## 2015-06-10 LAB — H. PYLORI ANTIBODY, IGG: H Pylori IgG: 2.7 U/mL — ABNORMAL HIGH (ref 0.0–0.8)

## 2015-06-10 LAB — GLUCOSE, CAPILLARY
GLUCOSE-CAPILLARY: 305 mg/dL — AB (ref 65–99)
GLUCOSE-CAPILLARY: 242 mg/dL — AB (ref 65–99)
Glucose-Capillary: 321 mg/dL — ABNORMAL HIGH (ref 65–99)

## 2015-06-10 LAB — URINALYSIS, ROUTINE W REFLEX MICROSCOPIC
BILIRUBIN URINE: NEGATIVE
Glucose, UA: 500 mg/dL — AB
HGB URINE DIPSTICK: NEGATIVE
Ketones, ur: NEGATIVE mg/dL
Nitrite: NEGATIVE
PH: 5 (ref 5.0–8.0)
Protein, ur: NEGATIVE mg/dL
Specific Gravity, Urine: 1.014 (ref 1.005–1.030)
UROBILINOGEN UA: 0.2 mg/dL (ref 0.0–1.0)

## 2015-06-10 LAB — URINE MICROSCOPIC-ADD ON

## 2015-06-10 LAB — LACTIC ACID, PLASMA: Lactic Acid, Venous: 1.6 mmol/L (ref 0.5–2.0)

## 2015-06-10 LAB — PROCALCITONIN: PROCALCITONIN: 6.1 ng/mL

## 2015-06-10 LAB — MAGNESIUM: Magnesium: 2.6 mg/dL — ABNORMAL HIGH (ref 1.7–2.4)

## 2015-06-10 MED ORDER — INSULIN ASPART 100 UNIT/ML ~~LOC~~ SOLN
0.0000 [IU] | Freq: Three times a day (TID) | SUBCUTANEOUS | Status: DC
  Administered 2015-06-10: 11 [IU] via SUBCUTANEOUS
  Administered 2015-06-10: 5 [IU] via SUBCUTANEOUS
  Administered 2015-06-10: 11 [IU] via SUBCUTANEOUS
  Administered 2015-06-11: 3 [IU] via SUBCUTANEOUS
  Administered 2015-06-11 – 2015-06-12 (×2): 2 [IU] via SUBCUTANEOUS

## 2015-06-10 MED ORDER — WARFARIN - PHARMACIST DOSING INPATIENT: Freq: Every day | Status: DC

## 2015-06-10 MED ORDER — WARFARIN SODIUM 2.5 MG PO TABS
2.5000 mg | ORAL_TABLET | Freq: Once | ORAL | Status: AC
  Administered 2015-06-10: 2.5 mg via ORAL
  Filled 2015-06-10: qty 1

## 2015-06-10 NOTE — Progress Notes (Signed)
PULMONARY / CRITICAL CARE MEDICINE   Name: Joe Love MRN: [id number] DOB: Oct 04, 1961    ADMISSION DATE:  06/05/2015 CONSULTATION DATE:  06/09/2015  REFERRING MD :  Dr. Tresa Endo  CHIEF COMPLAINT:  VT arrest  INITIAL PRESENTATION:  54 year old male with PMH as below, which includes HTN, HLD, and uncomplicated DM admitted for STEMI 6/28. Symptoms initially with onset 3 days PTA, which were described as indigestion while attending a wedding. He also had intermittent chest pain associated with nausea. This mostly resolved, but nausea and overall malaise still lingered so he presented to [facility] ED 6/28. In ED he was noted to have inferior ST segment elevations on EKG. He was taken emergently to cath lab, where he was found to have total occlusion of the mid distal LAD, total occlusion of the circumflex involving the OM 2 and distal circumflex and total occlusion of the RCA just beyond the ostium. RCA flow was successfully restored with a long arduous procedure including PTCA, thrombectomy, DES stenting of the ostium to the mid segment and PTCA of the distal RCA. Echo 6/29 found LV apical thrombus and severely reduced LVEF. He required epinephrine infusion. 7/1 he developed GI bleed and anticoagulation for LV thrombus was dc'd. Also developed AF RVR which was responsive to chemical cardioversion with amio. He had been improving since that time with CRRT, 7/3 he was able to come off epi gtt and CRRT. 7/4 he developed some intermittent runs of NSVT, which evenutally caused him to suffer a very brief VT arrest. Post arrest he was awake and in no distress. PCCM consulted to assist with medical management.    STUDIES:  6/29 Echo > LVEF 25-30%, diffuse hypokinesis with akinesis of apical segments, with apical thrombus.   SIGNIFICANT EVENTS: 6/28 > admitted for STEMI, to cath lab, PCI > cardiogenic shock 7/1 > started HD for AKI s/p cath 7/3 > off pressors, CVVHD stopped 7/4 > very brief VT  arrest   SUBJECTIVE:   06/10/15: transiently on lidocaine infusion but now ion amio gtt and levophed gtt. No more torsades. REsponded to lasix  and off o2. Face mask Never needed bipap. Still having black stools but none since morning 7am. Anticoagulation to be restarted by cards today. S/p prbc yesteday to get hgb > 8gm%  VITAL SIGNS: Temp:  [97.6 F (36.4 C)-99.5 F (37.5 C)] 97.6 F (36.4 C) (07/05 0800) Pulse Rate:  [31-95] 88 (07/05 0800) Resp:  [11-39] 27 (07/05 0800) BP: (66-157)/(29-90) 133/69 mmHg (07/05 0800) SpO2:  [93 %-100 %] 100 % (07/05 0800) Weight:  [114.4 kg (252 lb 3.3 oz)] 114.4 kg (252 lb 3.3 oz) (07/05 0500) HEMODYNAMICS: CVP:  [2 mmHg-6 mmHg] 2 mmHg VENTILATOR SETTINGS:   INTAKE / OUTPUT:  Intake/Output Summary (Last 24 hours) at 06/10/15 4098 Last data filed at 06/10/15 0900  Gross per 24 hour  Intake 3436.36 ml  Output   1550 ml  Net 1886.36 ml    PHYSICAL EXAMINATION: General:  Obese male in NAD Neuro:  Alert, oriented, NAD HEENT:  [place]/AT, no JVD noted, PERRL Cardiovascular:  RRR, no MRG Lungs:  Diminished bases Abdomen:  Soft, non-tender, non-distended Musculoskeletal:  No acute deformity, no edema Skin:  Grossly intact  LABS: PULMONARY  Recent Labs Lab 05/24/2015 1003 06/09/15 1900 06/10/15 0038  PHART  --   --  7.425  PCO2ART  --   --  32.2*  PO2ART  --   --  408*  HCO3  --   --  20.9  TCO2 13  --  21.9  O2SAT  --  46.0 99.8    CBC  Recent Labs Lab 06/09/15 0400 06/09/15 2004 06/10/15 0539  HGB 7.4* 7.2* 8.0*  HCT 23.2* 22.9* 24.1*  WBC 12.4* 23.9* 27.7*  PLT 170 241 261    COAGULATION  Recent Labs Lab 06/06/15 0320 06/13/2015 0518 06/08/15 0410 06/09/15 0400 06/10/15 0539  INR 1.85* 1.78* 1.52* 1.43 1.65*    CARDIAC   Recent Labs Lab 2015-06-17 1546  TROPONINI >65.00*   No results for input(s): PROBNP in the last 168 hours.   CHEMISTRY  Recent Labs Lab 07/06/2015 0518  06/08/15 0410 06/08/15 1713  06/09/15 0400 06/09/15 1545 06/09/15 2004 06/10/15 0539  NA 135  < > 138 136 133*  133* 133* 132* 131*  131*  K 3.3*  < > 3.4* 3.8 3.7  3.8 4.3 4.8 5.1  5.1  CL 99*  < > 101 101 98*  97* 100* 99* 98*  98*  CO2 25  < > 29 26 25  26 24  19* 22  22  GLUCOSE 136*  < > 131* 175* 207*  209* 217* 275* 303*  304*  BUN 77*  < > 34* 40* 58*  57* 82* 91* 107*  107*  CREATININE 4.43*  < > 2.65* 2.59* 3.14*  3.12* 3.15* 3.65* 3.89*  3.93*  CALCIUM 6.8*  < > 7.3* 7.4* 7.2*  7.2* 7.4* 7.4* 7.4*  7.4*  MG 2.2  --  2.3  --  2.2  --  2.3 2.6*  PHOS 5.2*  < > 3.8 3.4 3.7 3.9 5.2* 5.5*  < > = values in this interval not displayed. Estimated Creatinine Clearance: 27.9 mL/min (by C-G formula based on Cr of 3.89).   LIVER  Recent Labs Lab 06/06/15 0320  06/30/2015 0518  06/08/15 0410 06/08/15 1713 06/09/15 0400 06/09/15 1545 06/09/15 2004 06/10/15 0539  ALBUMIN  --   < > 2.6*  < > 2.4* 2.3* 2.3* 2.1* 2.1* 2.2*  INR 1.85*  --  1.78*  --  1.52*  --  1.43  --   --  1.65*  < > = values in this interval not displayed.   INFECTIOUS  Recent Labs Lab 06/09/15 2013 06/10/15 0050  LATICACIDVEN 4.9* 1.6     ENDOCRINE CBG (last 3)   Recent Labs  06/10/15 0848  GLUCAP 305*         IMAGING x48h  - image(s) personally visualized  -   highlighted in bold Dg Chest Port 1 View  06/10/2015   CLINICAL DATA:  Evaluate for evidence of aspiration; currently asymptomatic  EXAM: PORTABLE CHEST - 1 VIEW  COMPARISON:  Portable chest x-ray of June 09, 2015  FINDINGS: The lungs are reasonably well inflated and clear. The cardiac silhouette is mildly enlarged but stable. The pulmonary vascularity is normal. There is a large caliber right internal jugular venous catheter whose tip lies at the level of the junction of the right and left brachiocephalic veins. The left internal jugular venous catheter tip projects in the proximal SVC. There is no pleural effusion or pneumothorax. External  pacing/defibrillator pads are present. The bony thorax is unremarkable.  IMPRESSION: There is no evidence of pneumonia. There is enlargement the cardiac silhouette without evidence of pulmonary edema.   Electronically Signed   By: David  [place] M.D.   On: 06/10/2015 09:49   Dg Chest Port 1 View  06/09/2015   CLINICAL DATA:  Central line placement.  Initial encounter.  EXAM: PORTABLE CHEST - 1 VIEW  COMPARISON:  Chest radiograph performed earlier today at 6:51 p.m.  FINDINGS: A new left IJ line is noted ending about the proximal to mid SVC. A right IJ sheath is noted ending about the proximal SVC.  The lungs are well-aerated and clear. There is no evidence of focal opacification, pleural effusion or pneumothorax.  The cardiomediastinal silhouette is mildly enlarged. No acute osseous abnormalities are seen. External pacing pads are noted.  IMPRESSION: 1. New left IJ line noted ending about the proximal to mid SVC. 2. Mild cardiomegaly; lungs remain grossly clear.   Electronically Signed   By: Roanna RaiderJeffery  Chang M.D.   On: 06/09/2015 22:52   Dg Chest Port 1 View  06/09/2015   CLINICAL DATA:  Patient has problems getting comfortable and could not sleep well last night. Orthopnea.  EXAM: PORTABLE CHEST - 1 VIEW  COMPARISON:  June 06, 2015  FINDINGS: The mediastinal contour is normal. The heart size is enlarged. There is no focal infiltrate, pulmonary edema, or pleural effusion. The visualized skeletal structures are unremarkable.  IMPRESSION: Cardiomegaly.  Lungs are clear.   Electronically Signed   By: Sherian ReinWei-Chen  Lin M.D.   On: 06/09/2015 20:53         ASSESSMENT / PLAN:  PULMONARY A: Mild transient acute resp failure - resolved with lasix   P:   Supplemental O2 as needed,   CARDIOVASCULAR HD cath RIJ 7/1 >>> LIJ CVL 7/4 >>> A:  S/p PCI for STEMI Severe 3 vessel CAD Cardiogenic shock > improved VT arrest AF - RVR Elevated lactic immediately post CPR and shocks   - on levophed + amio gtt.  Torsade physioo resolved  P:  Per cards  RENAL A:   AKI in setting STEMI with PCI and cardiogenic shock (off CRRT 7/3) Hyperkalemia > improved Mild hyponatremia  - responded to lasix byt creat worsenuing  P:   Renal following   GASTROINTESTINAL A:   GI bleed > apparently resolved  P:   Holding anticoagulation for now per Cards/GI; restarting 06/10/15 Diet per primary team and GI Protonix IV push GI follwoing  HEMATOLOGIC A:   Anemia, suspect acute blood loss 2nd to GI bleeding LV apical thrombus - hgb 8gm% after PRBC  P:  Monitor CBC Goal hgb > / = 8gm% Transfuse per ICU guidelines On dual antiplatelet therapy   INFECTIOUS No results for input(s): PROCALCITON in the last 168 hours.  A:   No fever but WBC rising  P:   BCx2 7/4 >>> UC 7/4 >>> PCT algorithm 06/10/2015 Monitor off ABX for now  ENDOCRINE A:   DM  P:   CBG monitoring and SSI  NEUROLOGIC A:   No acute issues  P:   RASS goal: 0 Monitor   FAMILY  - Updates: patient updated 06/10/2015   - Inter-disciplinary family meet or Palliative Care meeting due by:  7/11    Dr. Kalman ShanMurali Eesha Schmaltz, M.D., F.C.C.P Pulmonary and Critical Care Medicine Staff Physician [facility] Pager: [phone], If no answer or between  15:00h - 7:00h: call [phone]  06/10/2015 9:58 AM

## 2015-06-10 NOTE — Progress Notes (Signed)
ANTICOAGULATION CONSULT NOTE - Follow Up Consult  Pharmacy Consult for coumadin Indication: apical thrombus  Allergies  Allergen Reactions  . Shrimp [Shellfish Allergy] Shortness Of Breath    Patient Measurements: Height: 5\' 11"  (180.3 cm) Weight: 252 lb 3.3 oz (114.4 kg) IBW/kg (Calculated) : 75.3  Vital Signs: Temp: 97.6 F (36.4 C) (07/05 0800) Temp Source: Oral (07/05 0800) BP: 133/69 mmHg (07/05 0800) Pulse Rate: 88 (07/05 0800)  Labs:  Recent Labs  06/08/15 0410  06/09/15 0400 06/09/15 1545 06/09/15 2004 06/10/15 0539  HGB 7.3*  --  7.4*  --  7.2* 8.0*  HCT 23.9*  --  23.2*  --  22.9* 24.1*  PLT 166  --  170  --  241 261  LABPROT 18.4*  --  17.6*  --   --  19.5*  INR 1.52*  --  1.43  --   --  1.65*  CREATININE 2.65*  < > 3.14*  3.12* 3.15* 3.65* 3.89*  3.93*  < > = values in this interval not displayed.  Estimated Creatinine Clearance: 27.9 mL/min (by C-G formula based on Cr of 3.89).  Assessment: 54 y/o male with h/o DM, HTN and HLD, but no prior cardiac history admitted for acute STEMI  Anticoagulation: afib and apical thrombus seen on ECHO. Coumadin added 6/30. 7/1 2nd Hep/warf d/ced d/t large bloody stool, GI - endoscopy erosions inj with epi.   New orders to resume warfarin with no heparin bridge today with goal INR 2-3 aiming for low end. Patient's INR on admission was 1.4, received one dose of 7.5mg  of coumadin and INR trended to 1.85, now back down to 1.6 today with no further warfarin given. Patient also started and continues on amiodarone gtt.   Goal of Therapy:  INR 2-3(aim for low end given recent bleed) Monitor platelets by anticoagulation protocol: Yes   Plan:  Warfarin 2.5mg  tonight Daily INR  Sheppard CoilFrank Osha Rane PharmD., BCPS Clinical Pharmacist Pager [phone] 06/10/2015 10:26 AM

## 2015-06-10 NOTE — Progress Notes (Signed)
Patient Name: Joe Love Date of Encounter: 06/10/2015     Principal Problem:   ST elevation myocardial infarction (STEMI) involving right coronary artery with complication Active Problems:   Acute renal failure syndrome   Acute systolic CHF (congestive heart failure)   Cardiogenic shock   Respiratory failure   Ventricular tachycardia   CAD (coronary artery disease)   Cardiomyopathy, ischemic   Diabetes mellitus type 2 with complications   PAF (paroxysmal atrial fibrillation)   Upper GI bleed   Gastritis   Duodenal ulcer disease   Anemia of renal disease   LV (left ventricular) mural thrombus following MI   Metabolic acidosis   Hyperlipidemia   Hyposmolality and/or hyponatremia   Hyperkalemia   Leukocytosis    SUBJECTIVE  54 y/o male with the above complex problem list that was admitted 6/28 with acute inferior STEMI.  Cath revealed probable old LAD and LCX occlusions along with a new RCA occlusion that req DES x 2.  EF 25-30% by echo with evidence of LV apical thrombus (coumadin added to asa/brilinta).  Post-PCI course complicated by acute on probably chronic renal failure req CRRT (7/1 and 7/2), cardiogenic shock with ongoing hypotension req vasopressor therapy, acute upper GIB s/p EGD on 7/2 revealing gastritis, gastric/duodenal erosions, and duodenal ulcer s/p hemoclipping (now on PPI, coumadin on hold since 6/30), anemia req PRBC's, and PAF.  On 7/4, pt was having a lot of ventricular ectopy, couplets, and runs of NSVT.  K/Mg ok.  Suffered VT arrest ~ 6:18 PM req defib x 3, lido bolus, and brief CPR.  No further VT.  No chest pain or sob.  No further prolonged runs of ventricular ectopy.  Remains on norepi/amio.  CURRENT MEDS . aspirin EC  81 mg Oral Daily  . atorvastatin  80 mg Oral q1800  . ferrous sulfate  325 mg Oral BID WC  . insulin aspart  0-15 Units Subcutaneous TID WC  . pantoprazole (PROTONIX) IV  40 mg Intravenous Q12H  . ticagrelor  90 mg Oral BID  .  warfarin   Does not apply Once    OBJECTIVE  Filed Vitals:   06/10/15 0630 06/10/15 0645 06/10/15 0715 06/10/15 0730  BP: 85/61 90/59 117/71 127/78  Pulse: 83 88 87 88  Temp:      TempSrc:      Resp: 22 20 32 24  Height:      Weight:      SpO2: 98% 100% 99% 99%    Intake/Output Summary (Last 24 hours) at 06/10/15 0853 Last data filed at 06/10/15 0700  Gross per 24 hour  Intake 2139.36 ml  Output   1550 ml  Net 589.36 ml   Filed Weights   06/08/15 0500 06/09/15 0401 06/10/15 0500  Weight: 252 lb 6.8 oz (114.5 kg) 252 lb 13.9 oz (114.7 kg) 252 lb 3.3 oz (114.4 kg)    PHYSICAL EXAM  General: Pleasant, NAD. Neuro: Alert and oriented X 3. Moves all extremities spontaneously. Psych: Normal affect. HEENT:  Normal  Neck: Supple without bruits or JVD. Lungs:  Resp regular and unlabored, CTA. Heart: RRR no s3, s4, or murmurs. Abdomen: Soft, non-tender, non-distended, BS + x 4.  Extremities: No clubbing, cyanosis or edema. DP/PT/Radials 2+ and equal bilaterally.  Accessory Clinical Findings  CBC  Recent Labs  06/09/15 2004 06/10/15 0539  WBC 23.9* 27.7*  HGB 7.2* 8.0*  HCT 22.9* 24.1*  MCV 73.6* 74.2*  PLT 241 261   Basic Metabolic Panel  Recent  Labs  06/09/15 2004 06/10/15 0539  NA 132* 131*  131*  K 4.8 5.1  5.1  CL 99* 98*  98*  CO2 19* 22  22  GLUCOSE 275* 303*  304*  BUN 91* 107*  107*  CREATININE 3.65* 3.89*  3.93*  CALCIUM 7.4* 7.4*  7.4*  MG 2.3 2.6*  PHOS 5.2* 5.5*   Liver Function Tests  Recent Labs  06/09/15 2004 06/10/15 0539  ALBUMIN 2.1* 2.2*   Lab Results  Component Value Date   INR 1.65* 06/10/2015   INR 1.43 06/09/2015   INR 1.52* 06/08/2015    TELE  Currently RSR.  Yesterday - freq PVC's, couplets, NSVT, VT  defib x 3.  Radiology/Studies  Ct Head Wo Contrast  06/05/2015   CLINICAL DATA:  Head injury after fall.  On Coumadin.  EXAM: CT HEAD WITHOUT CONTRAST  TECHNIQUE: Contiguous axial images were obtained from  the base of the skull through the vertex without intravenous contrast.  COMPARISON:  None.  FINDINGS: Bony calvarium appears intact. No mass effect or midline shift is noted. Ventricular size is within normal limits. There is no evidence of hemorrhage. Focal low density is seen peripherally in the left cerebellar hemisphere most consistent with infarction of indeterminate age.  IMPRESSION: Focal low density seen peripherally in left cerebellar hemisphere concerning for infarction of indeterminate age. MRI is recommended for further evaluation.   Electronically Signed   By: Lupita RaiderJames  Green Jr, M.D.   On: 06/05/2015 21:45   Dg Chest Port 1 View  06/09/2015   CLINICAL DATA:  Central line placement.  Initial encounter.  EXAM: PORTABLE CHEST - 1 VIEW  COMPARISON:  Chest radiograph performed earlier today at 6:51 p.m.  FINDINGS: A new left IJ line is noted ending about the proximal to mid SVC. A right IJ sheath is noted ending about the proximal SVC.  The lungs are well-aerated and clear. There is no evidence of focal opacification, pleural effusion or pneumothorax.  The cardiomediastinal silhouette is mildly enlarged. No acute osseous abnormalities are seen. External pacing pads are noted.  IMPRESSION: 1. New left IJ line noted ending about the proximal to mid SVC. 2. Mild cardiomegaly; lungs remain grossly clear.   Electronically Signed   By: Roanna RaiderJeffery  Chang M.D.   On: 06/09/2015 22:52   Dg Chest Port 1 View  06/09/2015   CLINICAL DATA:  Patient has problems getting comfortable and could not sleep well last night. Orthopnea.  EXAM: PORTABLE CHEST - 1 VIEW  COMPARISON:  June 06, 2015  FINDINGS: The mediastinal contour is normal. The heart size is enlarged. There is no focal infiltrate, pulmonary edema, or pleural effusion. The visualized skeletal structures are unremarkable.  IMPRESSION: Cardiomegaly.  Lungs are clear.   Electronically Signed   By: Sherian ReinWei-Chen  Lin M.D.   On: 06/09/2015 20:53   ASSESSMENT AND PLAN  1.  Acute inferior STEMI/severe multivessel CAD: Symptoms began ~ 3 days prior to admission.  Cath on 6/28 revealed severe 3 VD with presumably old LCX/LAD occlusions and a fresh RCA occlusion that was successfully treated with PTCA/thrombectomy, DES stenting from proximal to mid RCA with PTCA at acute margin and distal RCA (could not pass stent through sharp angled acute margin).  Troponin>65 (last checked 6/28).  In setting of VT arrest, f/u trop and ECG this AM.  No chest pain prior to VT arrest or since.  Cont asa, brilinta, and high potency statin.  He is ordered metoprolol 12.5 mg q8h, however continues  to require norepi - will hold bb.  No ace/arb in setting of ongoing hypotension and renal failure.  Eventual cardiac rehab.  2. Cardiogenic shock/Hypotension:  In setting of #1.  EF 25-30% with nl RV fxn by echo.  BP stable this AM on norepi.    3.  Acute systolic CHF/ICM:  See #2.  Given lasix last night.  Minus 4.1 L for admission.  Wt measured @ 252 on last three recordings, down from max of 264 on 6/30.  Unable to continue bb in setting of hypotension.  No acei/arb/spiro/arni in setting of hypotension/acute renal failure/hypokalemia.  4. VT Arrest:  Frequent ectopy/nsvt yesterday followed by VT arrest req amio gtt and lido bolus.  K/Mg wnl this AM.  F/u trop/ECG - ? New ischemia.  No chest pain.  5.  PAF with RVR: currently quiescent.  Remains on amio gtt following VT arrest last night.  Amio gtt has been contributing to hypotension during this admission, thus would like to get onto PO amio once ectopy under control.  CHA2DS2VASc = 4 (CHF, h/o HTN, DM, CAD).   Coumadin has been on hold in setting of acute upper GIB.  Per GI, ok to resume.  Anemia has been stable.  4. Acute renal failure. Post CRRT x 2 days. Good UO. Creat up further this AM.  Renal following closely.    5. LV apical thrombus:  Coumadin has been on hold.  Per GI note yesterday, ok to resume.  Will d/w Dr. Rennis Golden. Anemia has been  stable.  6.  Transient complete heart block possibly related to hyperkalemia:  Has not been an issue in past few days.  Will have to watch on amio. Long term should not be a contraindication to beta blocker therapy for CAD and CHF, though hypotension currently limiting usage.  7. GI bleed/gastritis/duodenal ulcer:  H/H stable.  Ok to resume coumadin per GI.  8. Microcytic anemia: suspect preexisting GI loss, exacerbated by current treatment and acute bleeding; on po iron. Stable s/p transfusions.  9. Hyperlidemia: LDL 181, now on atorvastatin 80 mg.  10. Type 2 DM:  Adding SSI.  A1c 9.9.    11. Morbid Obesity:  Eventual cardiac rehab.  12.  Leukocytosis:  WBC up to 27K.  Has not gotten steroids.  Will f/u cxr - ? Aspiration in setting of VT arrest last night.  Currently afebrile.  13.  Hyperkalemia:  K runs high nl - 5.1 this AM.  Follow.  Signed, Nicolasa Ducking NP

## 2015-06-10 NOTE — Progress Notes (Signed)
Patient ID: Joe Love, male   DOB: 10/03/1961, 54 y.o.   MRN: [id number]  [facility] Progress Note    Assessment/ Plan:   1. AKI: Secondary to ATN following an STEMI/cardiogenic shock +/-CINP. Continues to maintain a good urine output with some elevation of BUN/creatinine over the past 24 hours and mild hyperkalemia-no acute indications for hemodialysis. We'll continue to monitor closely for indications to do intermittent dialysis. Anticipate improvement of renal function with improving urine output. Advised on potassium restriction 2. Status post STEMI with PTA/thrombectomy and DES stenting from proximal to mid RCA and cardiogenic shock thought to be from right ventricular infarction-now off pressors. 3. Atrial fibrillation with rapid ventricular response: Appears to be rate controlled on metoprolol for currently appears to be in sinus 4. Anemia: Recent gastrointestinal bleed secondary to DU-off anticoagulants at this time. Status post PRBC transfusion yesterday 5. LV apical thrombus: To resume anticoagulation when agreeable by gastroenterology  Subjective:   Reports to be feeling fair-somewhat unhappy that kidney function did not recover as he was hoping for    Objective:   BP 127/78 mmHg  Pulse 88  Temp(Src) 98.7 F (37.1 C) (Oral)  Resp 24  Ht  (1.803 m)  Wt 114.4 kg (252 lb 3.3 oz)  BMI 35.19 kg/m2  SpO2 99%  Intake/Output Summary (Last 24 hours) at 06/10/15 0855 Last data filed at 06/10/15 0700  Gross per 24 hour  Intake 2139.36 ml  Output   1550 ml  Net 589.36 ml   Weight change: -0.3 kg (-10.6 oz)  Physical Exam: Gen: Comfortably resting in bed CVS: Pulse regular in rate and rhythm, S1 and S2 normal Resp: Decreased breath sounds over bases-poor inspiratory effort Abd: Soft, obese, nontender Ext: Trace lower extremity edema  Imaging: Dg Chest Port 1 View  06/09/2015   CLINICAL DATA:  Central line placement.  Initial encounter.  EXAM: PORTABLE  CHEST - 1 VIEW  COMPARISON:  Chest radiograph performed earlier today at 6:51 p.m.  FINDINGS: A new left IJ line is noted ending about the proximal to mid SVC. A right IJ sheath is noted ending about the proximal SVC.  The lungs are well-aerated and clear. There is no evidence of focal opacification, pleural effusion or pneumothorax.  The cardiomediastinal silhouette is mildly enlarged. No acute osseous abnormalities are seen. External pacing pads are noted.  IMPRESSION: 1. New left IJ line noted ending about the proximal to mid SVC. 2. Mild cardiomegaly; lungs remain grossly clear.   Electronically Signed   By: Roanna Raider M.D.   On: 06/09/2015 22:52   Dg Chest Port 1 View  06/09/2015   CLINICAL DATA:  Patient has problems getting comfortable and could not sleep well last night. Orthopnea.  EXAM: PORTABLE CHEST - 1 VIEW  COMPARISON:  June 06, 2015  FINDINGS: The mediastinal contour is normal. The heart size is enlarged. There is no focal infiltrate, pulmonary edema, or pleural effusion. The visualized skeletal structures are unremarkable.  IMPRESSION: Cardiomegaly.  Lungs are clear.   Electronically Signed   By: Sherian Rein M.D.   On: 06/09/2015 20:53    Labs: BMET  Recent Labs Lab Jul 04, 2015 1746 06/08/15 0410 06/08/15 1713 06/09/15 0400 06/09/15 1545 06/09/15 2004 06/10/15 0539  NA 134* 138 136 133*  133* 133* 132* 131*  131*  K 3.8 3.4* 3.8 3.7  3.8 4.3 4.8 5.1  5.1  CL 100* 101 101 98*  97* 100* 99* 98*  98*  CO2 26 29  26 25  26 24  19* 22  22  GLUCOSE 256* 131* 175* 207*  209* 217* 275* 303*  304*  BUN 57* 34* 40* 58*  57* 82* 91* 107*  107*  CREATININE 3.69* 2.65* 2.59* 3.14*  3.12* 3.15* 3.65* 3.89*  3.93*  CALCIUM 6.8* 7.3* 7.4* 7.2*  7.2* 7.4* 7.4* 7.4*  7.4*  PHOS 4.6 3.8 3.4 3.7 3.9 5.2* 5.5*   CBC  Recent Labs Lab 05/23/2015 0900  06/06/15 1600 06/06/15 2200  06/08/15 0410 06/09/15 0400 06/09/15 2004 06/10/15 0539  WBC 21.0*  < > 15.2* 13.8*  < >  10.9* 12.4* 23.9* 27.7*  NEUTROABS 17.2*  --  12.6* 11.5*  --   --   --   --   --   HGB 12.5*  < > 8.8* 8.2*  < > 7.3* 7.4* 7.2* 8.0*  HCT 39.9  < > 27.9* 25.4*  < > 23.9* 23.2* 22.9* 24.1*  MCV 67.2*  < > 65.6* 64.6*  < > 67.9* 71.2* 73.6* 74.2*  PLT 178  < > 211 188  < > 166 170 241 261  < > = values in this interval not displayed.  Medications:    . aspirin EC  81 mg Oral Daily  . atorvastatin  80 mg Oral q1800  . ferrous sulfate  325 mg Oral BID WC  . insulin aspart  0-15 Units Subcutaneous TID WC  . pantoprazole (PROTONIX) IV  40 mg Intravenous Q12H  . ticagrelor  90 mg Oral BID  . warfarin   Does not apply Once   Zetta BillsJay Eldean Klatt, MD 06/10/2015, 8:55 AM

## 2015-06-10 NOTE — Progress Notes (Signed)
Inpatient Diabetes Program Recommendations  AACE/ADA: New Consensus Statement on Inpatient Glycemic Control (2013)  Target Ranges:  Prepandial:   less than 140 mg/dL      Peak postprandial:   less than 180 mg/dL (1-2 hours)      Critically ill patients:  140 - 180 mg/dL   Reason for Visit: Hyperglycemia  Diabetes history: DM2 Outpatient Diabetes medications: metformin 1000 mg QD Current orders for Inpatient glycemic control: Novolog moderate tidwc  Results for Joe Love, Joe Love (MRN [id number]) as of 06/10/2015 13:18  Ref. Range 06/04/2015 07:46 06/04/2015 11:24 06/05/2015 20:09 06/10/2015 08:48 06/10/2015 11:55  Glucose-Capillary Latest Ref Range: 65-99 mg/dL 103 (H) 98 159 (H) 305 (H) 321 (H)   Would benefit from addition of basal insulin.   Inpatient Diabetes Program Recommendations Insulin - Basal: Levemir 15 units QHS HgbA1C: 9.9% - uncontrolled  Note: To speak with pt regarding his HgbA1C. If pt is to go home on insulin, will order insulin starter kit. Continue to follow. Thank you. Lorenda Peck, RD, LDN, CDE Inpatient Diabetes Coordinator [phone]

## 2015-06-10 NOTE — Progress Notes (Signed)
Daily Rounding Note  06/10/2015, 10:59 AM  LOS: 7 days    PCP is Elvina SidleKurt Lauenstein.  SUBJECTIVE:       "Tarry" stools x 3 overnight and one this AM.  Pt feels well, no chest pain, no SOB.   OBJECTIVE:         Vital signs in last 24 hours:    Temp:  [97.6 F (36.4 C)-99.5 F (37.5 C)] 97.6 F (36.4 C) (07/05 0800) Pulse Rate:  [31-93] 88 (07/05 0800) Resp:  [11-39] 27 (07/05 0800) BP: (66-157)/(29-90) 133/69 mmHg (07/05 0800) SpO2:  [93 %-100 %] 100 % (07/05 0800) Weight:  [252 lb 3.3 oz (114.4 kg)] 252 lb 3.3 oz (114.4 kg) (07/05 0500) Last BM Date: 06/10/15 Filed Weights   06/08/15 0500 06/09/15 0401 06/10/15 0500  Weight: 252 lb 6.8 oz (114.5 kg) 252 lb 13.9 oz (114.7 kg) 252 lb 3.3 oz (114.4 kg)   General: somewhat letahrgic, arouses easily.  Comfortable.  Does not look acutely ill   Heart: RRR Chest: clear bil.  No labored breathing or cough Abdomen: soft, NT, ND.  Active BS.    Extremities: no CCE Neuro/Psych:  Oriented to self and place, vague as to situation.  Moves all 4s.   Intake/Output from previous day: 07/04 0701 - 07/05 0700 In: 2309.4 [P.O.:1060; I.V.:547.7; Blood:651.7; IV Piggyback:50] Out: 1550 [Urine:1550]  Intake/Output this shift: Total I/O In: 1297 [P.O.:240; I.V.:1057] Out: 210 [Urine:210]  Lab Results:  Recent Labs  06/09/15 0400 06/09/15 2004 06/10/15 0539  WBC 12.4* 23.9* 27.7*  HGB 7.4* 7.2* 8.0*  HCT 23.2* 22.9* 24.1*  PLT 170 241 261   BMET  Recent Labs  06/09/15 1545 06/09/15 2004 06/10/15 0539  NA 133* 132* 131*  131*  K 4.3 4.8 5.1  5.1  CL 100* 99* 98*  98*  CO2 24 19* 22  22  GLUCOSE 217* 275* 303*  304*  BUN 82* 91* 107*  107*  CREATININE 3.15* 3.65* 3.89*  3.93*  CALCIUM 7.4* 7.4* 7.4*  7.4*   LFT  Recent Labs  06/09/15 1545 06/09/15 2004 06/10/15 0539  ALBUMIN 2.1* 2.1* 2.2*   PT/INR  Recent Labs  06/09/15 0400 06/10/15 0539    LABPROT 17.6* 19.5*  INR 1.43 1.65*   Hepatitis Panel No results for input(s): HEPBSAG, HCVAB, HEPAIGM, HEPBIGM in the last 72 hours.  Studies/Results: Dg Chest Port 1 View  06/10/2015   CLINICAL DATA:  Evaluate for evidence of aspiration; currently asymptomatic  EXAM: PORTABLE CHEST - 1 VIEW  COMPARISON:  Portable chest x-ray of June 09, 2015  FINDINGS: The lungs are reasonably well inflated and clear. The cardiac silhouette is mildly enlarged but stable. The pulmonary vascularity is normal. There is a large caliber right internal jugular venous catheter whose tip lies at the level of the junction of the right and left brachiocephalic veins. The left internal jugular venous catheter tip projects in the proximal SVC. There is no pleural effusion or pneumothorax. External pacing/defibrillator pads are present. The bony thorax is unremarkable.  IMPRESSION: There is no evidence of pneumonia. There is enlargement the cardiac silhouette without evidence of pulmonary edema.   Electronically Signed   By: David  SwazilandJordan M.D.   On: 06/10/2015 09:49   Dg Chest Port 1 View  06/09/2015   CLINICAL DATA:  Central line placement.  Initial encounter.  EXAM: PORTABLE CHEST - 1 VIEW  COMPARISON:  Chest radiograph performed earlier today at  6:51 p.m.  FINDINGS: A new left IJ line is noted ending about the proximal to mid SVC. A right IJ sheath is noted ending about the proximal SVC.  The lungs are well-aerated and clear. There is no evidence of focal opacification, pleural effusion or pneumothorax.  The cardiomediastinal silhouette is mildly enlarged. No acute osseous abnormalities are seen. External pacing pads are noted.  IMPRESSION: 1. New left IJ line noted ending about the proximal to mid SVC. 2. Mild cardiomegaly; lungs remain grossly clear.   Electronically Signed   By: Roanna Raider M.D.   On: 06/09/2015 22:52   Dg Chest Port 1 View  06/09/2015   CLINICAL DATA:  Patient has problems getting comfortable and could  not sleep well last night. Orthopnea.  EXAM: PORTABLE CHEST - 1 VIEW  COMPARISON:  June 06, 2015  FINDINGS: The mediastinal contour is normal. The heart size is enlarged. There is no focal infiltrate, pulmonary edema, or pleural effusion. The visualized skeletal structures are unremarkable.  IMPRESSION: Cardiomegaly.  Lungs are clear.   Electronically Signed   By: Sherian Rein M.D.   On: 06/09/2015 20:53   Scheduled Meds: . aspirin EC  81 mg Oral Daily  . atorvastatin  80 mg Oral q1800  . ferrous sulfate  325 mg Oral BID WC  . insulin aspart  0-15 Units Subcutaneous TID WC  . pantoprazole (PROTONIX) IV  40 mg Intravenous Q12H  . ticagrelor  90 mg Oral BID  . warfarin  2.5 mg Oral ONCE-1800  . Warfarin - Pharmacist Dosing Inpatient   Does not apply q1800   Continuous Infusions: . amiodarone 30 mg/hr (06/10/15 0900)  . norepinephrine (LEVOPHED) Adult infusion 19 mcg/min (06/10/15 0951)   PRN Meds:.Place/Maintain arterial line **AND** sodium chloride, acetaminophen, alum & mag hydroxide-simeth, heparin, ondansetron (ZOFRAN) IV, sodium chloride   ASSESMENT:   *  UGI bleed: melena, ABL anemia.  In setting heparin, coumadin.  Currently on Brilinta and Coumadin.  06/11/2015 EGD: 1) Gastric erosions and gastritis.  2) Duodenal ulcers and erosions.  3) Duodenal ulcer with visible vessel s/p hemoclipping.  Serum H pylori test in process.  On BID IV Protonix, was never on Protonix drip.  Hgb improved. Low iron and iron sats, normal ferritin, on BID oral iron per Dr Elnoria Howard.  "Tarry" stools overnight.   *  STEMI,  S/p RCA stent.  Afib/RVR, converted to NSR with amiodarone.  Code called (shocked x 2, lidocaine with ROSC) for pulseless V tach last night, On Brilinta.  Coumadin ordered to restart at 1800 today. .  *  Low density lesion left cerebellum, concerning for infarct of indeterminate age.   *  AKI.     PLAN   *  Continue low carb, HH diet.  Watch Hgb.   Leave on IV Protonix today, switch to po  tomorrow if blood counts stable.   *  Will stop po iron for now in order to avoid confusion as to nature of black, tarry stools.    Jennye Moccasin  06/10/2015, 10:59 AM Pager: [phone]  GI ATTENDING  Interval history and reviewed. Complex Case discussed with GI colleagues in morning report. Agree with interval progress note as outlined above. No obvious rebleeding after endoscopic hemostatic therapy for duodenal ulcer. Remains on blood thinners. Continue IV PPI drip for 72 hours before converting. Monitor blood counts closely. Transfuse as needed. If significant rebleeding were to occur, would recommend embolization therapy with vascular radiology. We'll follow  Wilhemina Bonito. Eda Keys.,  M.D. [facility] Division of Gastroenterology

## 2015-06-11 DIAGNOSIS — K922 Gastrointestinal hemorrhage, unspecified: Secondary | ICD-10-CM

## 2015-06-11 DIAGNOSIS — D62 Acute posthemorrhagic anemia: Secondary | ICD-10-CM

## 2015-06-11 DIAGNOSIS — B9681 Helicobacter pylori [H. pylori] as the cause of diseases classified elsewhere: Secondary | ICD-10-CM

## 2015-06-11 LAB — RENAL FUNCTION PANEL
ALBUMIN: 2.4 g/dL — AB (ref 3.5–5.0)
ANION GAP: 22 — AB (ref 5–15)
ANION GAP: 20 — AB (ref 5–15)
Albumin: 2.2 g/dL — ABNORMAL LOW (ref 3.5–5.0)
BUN: 143 mg/dL — AB (ref 6–20)
BUN: 118 mg/dL — ABNORMAL HIGH (ref 6–20)
CHLORIDE: 97 mmol/L — AB (ref 101–111)
CO2: 15 mmol/L — ABNORMAL LOW (ref 22–32)
CO2: 16 mmol/L — ABNORMAL LOW (ref 22–32)
CREATININE: 5.89 mg/dL — AB (ref 0.61–1.24)
Calcium: 8 mg/dL — ABNORMAL LOW (ref 8.9–10.3)
Calcium: 7.6 mg/dL — ABNORMAL LOW (ref 8.9–10.3)
Chloride: 95 mmol/L — ABNORMAL LOW (ref 101–111)
Creatinine, Ser: 4.83 mg/dL — ABNORMAL HIGH (ref 0.61–1.24)
GFR calc Af Amer: 14 mL/min — ABNORMAL LOW (ref >60)
GFR, EST AFRICAN AMERICAN: 11 mL/min — AB (ref >60)
GFR, EST NON AFRICAN AMERICAN: 10 mL/min — AB (ref >60)
GFR, EST NON AFRICAN AMERICAN: 12 mL/min — AB (ref >60)
Glucose, Bld: 181 mg/dL — ABNORMAL HIGH (ref 65–99)
Glucose, Bld: 228 mg/dL — ABNORMAL HIGH (ref 65–99)
POTASSIUM: 5.2 mmol/L — AB (ref 3.5–5.1)
Phosphorus: 11.1 mg/dL — ABNORMAL HIGH (ref 2.5–4.6)
Phosphorus: 9.1 mg/dL — ABNORMAL HIGH (ref 2.5–4.6)
Potassium: 6.2 mmol/L (ref 3.5–5.1)
Sodium: 132 mmol/L — ABNORMAL LOW (ref 135–145)
Sodium: 133 mmol/L — ABNORMAL LOW (ref 135–145)

## 2015-06-11 LAB — PREPARE RBC (CROSSMATCH)

## 2015-06-11 LAB — CBC
HEMATOCRIT: 20.4 % — AB (ref 39.0–52.0)
HEMATOCRIT: 28.2 % — AB (ref 39.0–52.0)
HEMOGLOBIN: 9.6 g/dL — AB (ref 13.0–17.0)
Hemoglobin: 6.6 g/dL — CL (ref 13.0–17.0)
MCH: 24.4 pg — ABNORMAL LOW (ref 26.0–34.0)
MCH: 25.9 pg — ABNORMAL LOW (ref 26.0–34.0)
MCHC: 32.4 g/dL (ref 30.0–36.0)
MCHC: 34 g/dL (ref 30.0–36.0)
MCV: 75.3 fL — AB (ref 78.0–100.0)
MCV: 76.2 fL — AB (ref 78.0–100.0)
Platelets: 358 10*3/uL (ref 150–400)
Platelets: 342 10*3/uL (ref 150–400)
RBC: 2.71 MIL/uL — ABNORMAL LOW (ref 4.22–5.81)
RBC: 3.7 MIL/uL — ABNORMAL LOW (ref 4.22–5.81)
RDW: 22.4 % — ABNORMAL HIGH (ref 11.5–15.5)
RDW: 21 % — AB (ref 11.5–15.5)
WBC: 48.4 10*3/uL — ABNORMAL HIGH (ref 4.0–10.5)
WBC: 55.8 10*3/uL (ref 4.0–10.5)

## 2015-06-11 LAB — GLUCOSE, CAPILLARY
GLUCOSE-CAPILLARY: 171 mg/dL — AB (ref 65–99)
Glucose-Capillary: 190 mg/dL — ABNORMAL HIGH (ref 65–99)
Glucose-Capillary: 147 mg/dL — ABNORMAL HIGH (ref 65–99)
Glucose-Capillary: 218 mg/dL — ABNORMAL HIGH (ref 65–99)

## 2015-06-11 LAB — PROTIME-INR
INR: 2.96 — ABNORMAL HIGH (ref 0.00–1.49)
Prothrombin Time: 30.3 seconds — ABNORMAL HIGH (ref 11.6–15.2)

## 2015-06-11 LAB — MAGNESIUM: Magnesium: 2.9 mg/dL — ABNORMAL HIGH (ref 1.7–2.4)

## 2015-06-11 LAB — PROCALCITONIN: PROCALCITONIN: 9.58 ng/mL

## 2015-06-11 MED ORDER — PRISMASOL BGK 0/2.5 32-2.5 MEQ/L IV SOLN
INTRAVENOUS | Status: DC
  Administered 2015-06-11: 09:00:00 via INTRAVENOUS_CENTRAL
  Filled 2015-06-11 (×2): qty 5000

## 2015-06-11 MED ORDER — SODIUM CHLORIDE 0.9 % IV SOLN
2000.0000 mg | Freq: Once | INTRAVENOUS | Status: AC
  Administered 2015-06-11: 2000 mg via INTRAVENOUS
  Filled 2015-06-11: qty 2000

## 2015-06-11 MED ORDER — PRISMASOL BGK 0/2.5 32-2.5 MEQ/L IV SOLN
INTRAVENOUS | Status: DC
  Administered 2015-06-11 – 2015-06-12 (×5): via INTRAVENOUS_CENTRAL
  Filled 2015-06-11 (×17): qty 5000

## 2015-06-11 MED ORDER — SODIUM CHLORIDE 0.9 % IV BOLUS (SEPSIS)
500.0000 mL | Freq: Once | INTRAVENOUS | Status: AC
  Administered 2015-06-11: 500 mL via INTRAVENOUS

## 2015-06-11 MED ORDER — PRISMASOL BGK 0/2.5 32-2.5 MEQ/L IV SOLN
INTRAVENOUS | Status: DC
  Administered 2015-06-11 – 2015-06-12 (×2): via INTRAVENOUS_CENTRAL
  Filled 2015-06-11 (×6): qty 5000

## 2015-06-11 MED ORDER — HEPARIN (PORCINE) 2000 UNITS/L FOR CRRT
INTRAVENOUS_CENTRAL | Status: DC | PRN
Start: 2015-06-11 — End: 2015-06-12
  Administered 2015-06-11 (×2): via INTRAVENOUS_CENTRAL
  Filled 2015-06-11 (×4): qty 1000

## 2015-06-11 MED ORDER — SODIUM CHLORIDE 0.9 % IV SOLN: Freq: Once | INTRAVENOUS | Status: DC

## 2015-06-11 MED ORDER — VANCOMYCIN HCL IN DEXTROSE 1-5 GM/200ML-% IV SOLN
1000.0000 mg | INTRAVENOUS | Status: DC
  Filled 2015-06-11: qty 200

## 2015-06-11 MED ORDER — PIPERACILLIN-TAZOBACTAM 3.375 G IVPB 30 MIN
3.3750 g | Freq: Four times a day (QID) | INTRAVENOUS | Status: DC
  Administered 2015-06-11 – 2015-06-12 (×3): 3.375 g via INTRAVENOUS
  Filled 2015-06-11 (×7): qty 50

## 2015-06-11 NOTE — Progress Notes (Signed)
CRITICAL VALUE ALERT  Critical value received:  Hgb 6.6     K 6.2  Date of notification:  06/11/2015  Time of notification:  0615 Hgb/ 0620 K  Critical value read back:Yes.    Nurse who received alert:  Dorna LeitzAmanda Guffey RN  MD notified (1st page):  Dr. Iver NestleBhagat  Time of first page:  [phone]  Reported off to dayshift RN, awaiting call back.

## 2015-06-11 NOTE — Progress Notes (Signed)
Inpatient Diabetes Program Recommendations  AACE/ADA: New Consensus Statement on Inpatient Glycemic Control (2013)  Target Ranges:  Prepandial:   less than 140 mg/dL      Peak postprandial:   less than 180 mg/dL (1-2 hours)      Critically ill patients:  140 - 180 mg/dL   Reason for Visit: Hyperglycemia  Results for HARL, WIECHMANN (MRN [id number]) as of 06/11/2015 09:50  Ref. Range 06/10/2015 08:48 06/10/2015 11:55 06/10/2015 17:53 06/10/2015 21:03 06/11/2015 07:26  Glucose-Capillary Latest Ref Range: 65-99 mg/dL 305 (H) 321 (H) 242 (H) 190 (H) 147 (H)   Would benefit from tighter glycemic control. Will need medication adjustment for DM meds prior to discharge.   Inpatient Diabetes Program Recommendations Insulin - Basal: Levemir 15 units QHS HgbA1C: 9.9% - uncontrolled Note: If pt is to go home on insulin, will order insulin starter kit and RN to begin teaching insulin administration. Will follow. Thank you. Lorenda Peck, RD, LDN, CDE Inpatient Diabetes Coordinator [phone]

## 2015-06-11 NOTE — Progress Notes (Addendum)
Patient ID: Joe Love, male   DOB: January 08, 1961, 54 y.o.   MRN: [id number]  [facility] Progress Note    Assessment/ Plan:   1. AKI: Secondary to ATN following an STEMI/cardiogenic shock +/-CINP. Decreased urine output overnight with significant worsening of labs/renal function and emergence of hyperkalemia. He continues to feel poorly with nausea/fatigue. Restart CRRT given continued pressor dependent hypotension and what appears to be high catabolic rate for clearance/regulation of metabolic abnormalities. 2. Status post STEMI with PTA/thrombectomy and DES stenting from proximal to mid RCA and cardiogenic shock thought to be from right ventricular infarction-now off pressors. 3. Atrial fibrillation with rapid ventricular response: Appears to be rate controlled on metoprolol for currently appears to be in sinus 4. Anemia: Recent gastrointestinal bleed secondary to DU-low hemoglobin, will transfuse 2 units packed red cells while on CRRT 5. LV apical thrombus: Restarted on Coumadin---may need to hold this again  Subjective:   Reports nausea and feeling poorly overnight-no bowel movements/no further melena.    Objective:   BP 85/48 mmHg  Pulse 79  Temp(Src) 97.4 F (36.3 C) (Oral)  Resp 28  Ht  (1.803 m)  Wt 114 kg (251 lb 5.2 oz)  BMI 35.07 kg/m2  SpO2 100%  Intake/Output Summary (Last 24 hours) at 06/11/15 0805 Last data filed at 06/11/15 0700  Gross per 24 hour  Intake 2246.55 ml  Output    960 ml  Net 1286.55 ml   Weight change: -0.4 kg (-14.1 oz)  Physical Exam: Gen: Appears somewhat uncomfortable resting in bed CVS: Pulse regular in rate and rhythm, S1 and S2 normal Resp: Decreased breath sounds over bases-poor inspiratory effort Abd: Soft, obese, nontender Ext: Trace lower extremity edema  Imaging: Dg Chest Port 1 View  06/10/2015   CLINICAL DATA:  Evaluate for evidence of aspiration; currently asymptomatic  EXAM: PORTABLE CHEST - 1 VIEW   COMPARISON:  Portable chest x-ray of June 09, 2015  FINDINGS: The lungs are reasonably well inflated and clear. The cardiac silhouette is mildly enlarged but stable. The pulmonary vascularity is normal. There is a large caliber right internal jugular venous catheter whose tip lies at the level of the junction of the right and left brachiocephalic veins. The left internal jugular venous catheter tip projects in the proximal SVC. There is no pleural effusion or pneumothorax. External pacing/defibrillator pads are present. The bony thorax is unremarkable.  IMPRESSION: There is no evidence of pneumonia. There is enlargement the cardiac silhouette without evidence of pulmonary edema.   Electronically Signed   By: David  [place] M.D.   On: 06/10/2015 09:49   Dg Chest Port 1 View  06/09/2015   CLINICAL DATA:  Central line placement.  Initial encounter.  EXAM: PORTABLE CHEST - 1 VIEW  COMPARISON:  Chest radiograph performed earlier today at 6:51 p.m.  FINDINGS: A new left IJ line is noted ending about the proximal to mid SVC. A right IJ sheath is noted ending about the proximal SVC.  The lungs are well-aerated and clear. There is no evidence of focal opacification, pleural effusion or pneumothorax.  The cardiomediastinal silhouette is mildly enlarged. No acute osseous abnormalities are seen. External pacing pads are noted.  IMPRESSION: 1. New left IJ line noted ending about the proximal to mid SVC. 2. Mild cardiomegaly; lungs remain grossly clear.   Electronically Signed   By: Roanna Raider M.D.   On: 06/09/2015 22:52   Dg Chest Port 1 View  06/09/2015   CLINICAL DATA:  Patient has problems getting comfortable and could not sleep well last night. Orthopnea.  EXAM: PORTABLE CHEST - 1 VIEW  COMPARISON:  June 06, 2015  FINDINGS: The mediastinal contour is normal. The heart size is enlarged. There is no focal infiltrate, pulmonary edema, or pleural effusion. The visualized skeletal structures are unremarkable.  IMPRESSION:  Cardiomegaly.  Lungs are clear.   Electronically Signed   By: Sherian ReinWei-Chen  Lin M.D.   On: 06/09/2015 20:53    Labs: BMET  Recent Labs Lab 06/08/15 0410 06/08/15 1713 06/09/15 0400 06/09/15 1545 06/09/15 2004 06/10/15 0539 06/11/15 0530  NA 138 136 133*  133* 133* 132* 131*  131* 132*  K 3.4* 3.8 3.7  3.8 4.3 4.8 5.1  5.1 6.2*  CL 101 101 98*  97* 100* 99* 98*  98* 95*  CO2 29 26 25  26 24  19* 22  22 15*  GLUCOSE 131* 175* 207*  209* 217* 275* 303*  304* 181*  BUN 34* 40* 58*  57* 82* 91* 107*  107* 143*  CREATININE 2.65* 2.59* 3.14*  3.12* 3.15* 3.65* 3.89*  3.93* 5.89*  CALCIUM 7.3* 7.4* 7.2*  7.2* 7.4* 7.4* 7.4*  7.4* 8.0*  PHOS 3.8 3.4 3.7 3.9 5.2* 5.5* 11.1*   CBC  Recent Labs Lab 06/06/15 1600 06/06/15 2200  06/09/15 0400 06/09/15 2004 06/10/15 0539 06/11/15 0530  WBC 15.2* 13.8*  < > 12.4* 23.9* 27.7* 48.4*  NEUTROABS 12.6* 11.5*  --   --   --   --   --   HGB 8.8* 8.2*  < > 7.4* 7.2* 8.0* 6.6*  HCT 27.9* 25.4*  < > 23.2* 22.9* 24.1* 20.4*  MCV 65.6* 64.6*  < > 71.2* 73.6* 74.2* 75.3*  PLT 211 188  < > 170 241 261 358  < > = values in this interval not displayed.  Medications:    . aspirin EC  81 mg Oral Daily  . atorvastatin  80 mg Oral q1800  . insulin aspart  0-15 Units Subcutaneous TID WC  . pantoprazole (PROTONIX) IV  40 mg Intravenous Q12H  . ticagrelor  90 mg Oral BID  . Warfarin - Pharmacist Dosing Inpatient   Does not apply q1800   Zetta BillsJay Taleshia Luff, MD 06/11/2015, 8:05 AM   .

## 2015-06-11 NOTE — Progress Notes (Addendum)
Called about WC count now >55K. Suspect this is leukemoid reaction. There is no fever, although he is hypotensive, requiring more levophed. Procalcitonin is elevated, but not markedly. Will ask PCCM to re-evaluate for possible infectious etiology, ?need for broad-spectrum antibiotics.  Good response to transfusion today. If be declines further, would recommend adding dobutamine.  Chrystie NoseKenneth C. Hilty, MD, [facility] CenterFACC Attending Cardiologist [facility]  ADDENDUM: Discussed with PCCM - recommending broad spectrum antibiotics, unclear if infectious source. Also recommending giving additional volume - 500 cc saline. Could add back to the CVVH circuit.

## 2015-06-11 NOTE — Progress Notes (Signed)
ANTIBIOTIC CONSULT NOTE - INITIAL  Pharmacy Consult for Vancomycin and Zosyn Indication: rule out sepsis  Allergies  Allergen Reactions  . Shrimp [Shellfish Allergy] Shortness Of Breath    Patient Measurements: Height: 5\' 11"  (180.3 cm) Weight: 251 lb 5.2 oz (114 kg) IBW/kg (Calculated) : 75.3 Adjusted Body Weight: 90.8 kg  Vital Signs: Temp: 97 F (36.1 C) (07/06 1503) Temp Source: Oral (07/06 1243) BP: 113/35 mmHg (07/06 1700) Pulse Rate: 71 (07/06 1700) Intake/Output from previous day: 07/05 0701 - 07/06 0700 In: 2526.7 [P.O.:720; I.V.:1806.7] Out: 960 [Urine:960] Intake/Output from this shift: Total I/O In: 1360 [P.O.:180; I.V.:510; Blood:670] Out: 676 [Other:676]  Labs:  Recent Labs  06/10/15 0539 06/11/15 0530 06/11/15 1630 06/11/15 1631  WBC 27.7* 48.4*  --  55.8*  HGB 8.0* 6.6*  --  9.6*  PLT 261 358  --  342  CREATININE 3.89*  3.93* 5.89* 4.83*  --    Estimated Creatinine Clearance: 22.5 mL/min (by C-G formula based on Cr of 4.83). No results for input(s): VANCOTROUGH, VANCOPEAK, VANCORANDOM, GENTTROUGH, GENTPEAK, GENTRANDOM, TOBRATROUGH, TOBRAPEAK, TOBRARND, AMIKACINPEAK, AMIKACINTROU, AMIKACIN in the last 72 hours.   Microbiology: Recent Results (from the past 720 hour(s))  MRSA PCR Screening     Status: None   Collection Time: 2015/03/25  1:03 PM  Result Value Ref Range Status   MRSA by PCR NEGATIVE NEGATIVE Final    Comment:        The GeneXpert MRSA Assay (FDA approved for NASAL specimens only), is one component of a comprehensive MRSA colonization surveillance program. It is not intended to diagnose MRSA infection nor to guide or monitor treatment for MRSA infections.     Medical History: Past Medical History  Diagnosis Date  . Hypertension   . Hyperlipidemia   . Diabetes mellitus without complication     Medications:  Scheduled:  . sodium chloride   Intravenous Once  . aspirin EC  81 mg Oral Daily  . atorvastatin  80 mg Oral  q1800  . insulin aspart  0-15 Units Subcutaneous TID WC  . pantoprazole (PROTONIX) IV  40 mg Intravenous Q12H  . piperacillin-tazobactam  3.375 g Intravenous 4 times per day  . sodium chloride  500 mL Intravenous Once  . ticagrelor  90 mg Oral BID  . vancomycin  2,000 mg Intravenous Once   Followed by  . [START ON 06/11/2015] vancomycin  1,000 mg Intravenous Q24H   Assessment: 54 yo M admitted 2015-12-03 with STEMI.  Hospital course has been complicated by LV apical thrombus and GIB after the initiation of anticoagulation, VT arrest, and ARF currently requiring CRRT.  Over the last 48 hours, WBC has increased from 12 >> 27 >> 55.  Pt is afebrile but hypotensive requiring pressors.  Procalcitonin is slightly elevated.  To start empiric abx for r/o sepsis.    Based on patient's obesity, will use an adjusted body weight for antibiotic dosing with CRRT.  Goal of Therapy:  Vancomycin trough level 15-20 mcg/ml Appropriate abx dosing based on changing renal function  Plan:  Vancomycin 2000 mg IV x 1 (~ 20mg /kg loading dose) Followed by Vancomycin 1000 mg IV q24h (~ 10mg /kg maintenance dose) Zosyn 3.375 gm IV q6h while on CRRT Follow-up renal function (continuation of CRRT), culture data, and clinical progress. Vancomycin trough level as indicated.  Toys 'R' UsKimberly Myriah Boggus, Pharm.D., BCPS Clinical Pharmacist Pager [phone] 06/11/2015 6:37 PM

## 2015-06-11 NOTE — Progress Notes (Addendum)
Patient Name: Joe Love Date of Encounter: 06/11/2015     Principal Problem:   ST elevation myocardial infarction (STEMI) involving right coronary artery with complication Active Problems:   Acute renal failure syndrome   Acute systolic CHF (congestive heart failure)   Anemia of renal disease   LV (left ventricular) mural thrombus following MI   Metabolic acidosis   Diabetes mellitus type 2 with complications   Hyperlipidemia   Hyposmolality and/or hyponatremia   Cardiogenic shock   Respiratory failure   PAF (paroxysmal atrial fibrillation)   Ventricular tachycardia   CAD (coronary artery disease)   Hyperkalemia   Upper GI bleed   Gastritis   Duodenal ulcer disease   Cardiomyopathy, ischemic   Leukocytosis   Duodenal ulcer hemorrhage   Acute blood loss anemia    SUBJECTIVE  54 y/o male with the above complex problem list that was admitted 6/28 with acute inferior STEMI.  Cath revealed probable old LAD and LCX occlusions along with a new RCA occlusion that req DES x 2.  EF 25-30% by echo with evidence of LV apical thrombus (coumadin added to asa/brilinta).  Post-PCI course complicated by acute on probably chronic renal failure req CRRT (7/1 and 7/2), cardiogenic shock with ongoing hypotension req vasopressor therapy, acute upper GIB s/p EGD on 7/2 revealing gastritis, gastric/duodenal erosions, and duodenal ulcer s/p hemoclipping (now on PPI, coumadin on hold since 6/30), anemia req PRBC's, and PAF.  Interval History Overnight there was no recurrent ventricular ectopy. Labs this am show a marked decline in renal function with creatinine of 5.89, potassium of 6.2 and BUN of 143. Have discussed with Dr. Allena Katz of nephrology and they will order to start CVVHD today. Troponin yesterday peaked at 18. Still requiring levophed.  CURRENT MEDS . aspirin EC  81 mg Oral Daily  . atorvastatin  80 mg Oral q1800  . insulin aspart  0-15 Units Subcutaneous TID WC  . pantoprazole  (PROTONIX) IV  40 mg Intravenous Q12H  . ticagrelor  90 mg Oral BID  . Warfarin - Pharmacist Dosing Inpatient   Does not apply q1800    OBJECTIVE  Filed Vitals:   06/11/15 0500 06/11/15 0501 06/11/15 0600 06/11/15 0700  BP:  91/50 99/52 85/48   Pulse:  76 76 79  Temp:    97.4 F (36.3 C)  TempSrc:    Oral  Resp:  43 28 28  Height:      Weight: 251 lb 5.2 oz (114 kg)     SpO2:  98% 99% 100%    Intake/Output Summary (Last 24 hours) at 06/11/15 0803 Last data filed at 06/11/15 0700  Gross per 24 hour  Intake 2246.55 ml  Output    960 ml  Net 1286.55 ml   Filed Weights   06/09/15 0401 06/10/15 0500 06/11/15 0500  Weight: 252 lb 13.9 oz (114.7 kg) 252 lb 3.3 oz (114.4 kg) 251 lb 5.2 oz (114 kg)    PHYSICAL EXAM  General: Pleasant, NAD. Neuro: Alert and oriented X 3. Moves all extremities spontaneously. Psych: Normal affect. HEENT:  Normal  Neck: Supple without bruits or JVD. Lungs:  Resp regular and unlabored, CTA. Heart: RRR no s3, s4, or murmurs. Abdomen: Soft, non-tender, non-distended, BS + x 4.  Extremities: Cool, dry, No clubbing, cyanosis or edema. 1+ pulses bilaterally.  Accessory Clinical Findings  CBC  Recent Labs  06/10/15 0539 06/11/15 0530  WBC 27.7* 48.4*  HGB 8.0* 6.6*  HCT 24.1* 20.4*  MCV 74.2* 75.3*  PLT 261 358   Basic Metabolic Panel  Recent Labs  06/10/15 0539 06/11/15 0530  NA 131*  131* 132*  K 5.1  5.1 6.2*  CL 98*  98* 95*  CO2 22  22 15*  GLUCOSE 303*  304* 181*  BUN 107*  107* 143*  CREATININE 3.89*  3.93* 5.89*  CALCIUM 7.4*  7.4* 8.0*  MG 2.6* 2.9*  PHOS 5.5* 11.1*   Liver Function Tests  Recent Labs  06/10/15 0539 06/11/15 0530  ALBUMIN 2.2* 2.2*   Lab Results  Component Value Date   INR 2.96* 06/11/2015   INR 1.65* 06/10/2015   INR 1.43 06/09/2015    TELE  Currently NSR - no further ventricular ectopy overnight.  Radiology/Studies  Ct Head Wo Contrast  06/05/2015   CLINICAL DATA:  Head  injury after fall.  On Coumadin.  EXAM: CT HEAD WITHOUT CONTRAST  TECHNIQUE: Contiguous axial images were obtained from the base of the skull through the vertex without intravenous contrast.  COMPARISON:  None.  FINDINGS: Bony calvarium appears intact. No mass effect or midline shift is noted. Ventricular size is within normal limits. There is no evidence of hemorrhage. Focal low density is seen peripherally in the left cerebellar hemisphere most consistent with infarction of indeterminate age.  IMPRESSION: Focal low density seen peripherally in left cerebellar hemisphere concerning for infarction of indeterminate age. MRI is recommended for further evaluation.   Electronically Signed   By: Lupita RaiderJames  Green Jr, M.D.   On: 06/05/2015 21:45   Dg Chest Port 1 View  06/09/2015   CLINICAL DATA:  Central line placement.  Initial encounter.  EXAM: PORTABLE CHEST - 1 VIEW  COMPARISON:  Chest radiograph performed earlier today at 6:51 p.m.  FINDINGS: A new left IJ line is noted ending about the proximal to mid SVC. A right IJ sheath is noted ending about the proximal SVC.  The lungs are well-aerated and clear. There is no evidence of focal opacification, pleural effusion or pneumothorax.  The cardiomediastinal silhouette is mildly enlarged. No acute osseous abnormalities are seen. External pacing pads are noted.  IMPRESSION: 1. New left IJ line noted ending about the proximal to mid SVC. 2. Mild cardiomegaly; lungs remain grossly clear.   Electronically Signed   By: Roanna RaiderJeffery  Chang M.D.   On: 06/09/2015 22:52   Dg Chest Port 1 View  06/09/2015   CLINICAL DATA:  Patient has problems getting comfortable and could not sleep well last night. Orthopnea.  EXAM: PORTABLE CHEST - 1 VIEW  COMPARISON:  June 06, 2015  FINDINGS: The mediastinal contour is normal. The heart size is enlarged. There is no focal infiltrate, pulmonary edema, or pleural effusion. The visualized skeletal structures are unremarkable.  IMPRESSION: Cardiomegaly.   Lungs are clear.   Electronically Signed   By: Sherian ReinWei-Chen  Lin M.D.   On: 06/09/2015 20:53   ASSESSMENT AND PLAN  1. Acute inferior STEMI/severe multivessel CAD: Symptoms began ~ 3 days prior to admission.  Cath on 6/28 revealed severe 3 VD with presumably old LCX/LAD occlusions and a fresh RCA occlusion that was successfully treated with PTCA/thrombectomy, DES stenting from proximal to mid RCA with PTCA at acute margin and distal RCA (could not pass stent through sharp angled acute margin).  Troponin>65 (last checked 6/28).  In setting of VT arrest, f/u trop and ECG this AM.  No chest pain prior to VT arrest or since.  Cont asa, brilinta, and high potency statin.  He is ordered metoprolol 12.5  mg q8h, however continues to require norepi - will hold bb.  No ace/arb in setting of ongoing hypotension and renal failure.  Eventual cardiac rehab.  2. Cardiogenic shock/Hypotension:  In setting of #1.  EF 25-30% with nl RV fxn by echo. Continues to require levophed. Cool extremities. Check c0-0x after transfusion today.  3.  Acute systolic CHF/ICM:  See #2.  Volume overloaded and oliguric with rising creatinine. Will need CVVHD today.  4. VT Arrest:  Frequent ectopy/nsvt yesterday followed by VT arrest req amio gtt and lido bolus.  K/Mg wnl this AM.  F/u trop/ECG - ? New ischemia.  No chest pain.  5.  PAF with RVR: currently quiescent.  Remains on amio gtt following VT arrest last night.  Amio gtt has been contributing to hypotension during this admission, thus would like to get onto PO amio once ectopy under control.  CHA2DS2VASc = 4 (CHF, h/o HTN, DM, CAD).   Coumadin has been on hold in setting of acute upper GIB.  Per GI, ok to resume.  Anemia has been stable.  4. Acute renal failure. Creatinine rising - BUN elevated today, discussed with Dr. Allena Katz, will need CVVHD.  5. LV apical thrombus:  Coumadin restarted yesterday - INR jumped to 2.96. Will d/c today given declining H/H.    7. GI  bleed/gastritis/duodenal ulcer:  No evidence for re-bleeding, but H/H declining. Plan to transfuse 2U PRBC's today.  8. Microcytic anemia: suspect preexisting GI loss, exacerbated by current treatment and acute bleeding; on po iron. Stable s/p transfusions.  9. Hyperlidemia: LDL 181, now on atorvastatin 80 mg.  10. Type 2 DM:  Adding SSI.  A1c 9.9.    11. Morbid Obesity:  Eventual cardiac rehab.  12.  Leukocytosis:  WBC up to 48K.  Has not gotten steroids, ?DIC sepsis picture. Procalcitonin is rising up to 9.58. Afebrile overnight. Appreciate PCCM recommendations.  CRITICAL CARE:  The patient is critically ill with multi-organ system failure and requires high complexity decision making for assessment and support, frequent evaluation and titration of therapies, application of advanced monitoring technologies and extensive interpretation of multiple databases.  TIME SPENT DIRECTLY WITH PATIENT: 45 minutes  Chrystie Nose, MD, [facility] Attending Cardiologist [facility]  Chrystie Nose

## 2015-06-11 NOTE — Progress Notes (Addendum)
Daily Rounding Note  06/11/2015, 8:33 AM  LOS: 8 days   SUBJECTIVE:       Starting CRRT this AM.  More blood ordered for drop in Hgb. Remains on Levophed, accelerating dosing in last 24 hours. . Pt has anorexia but no nausea, no abdominal pain, no SOB.  Feels a "catch" in RUQ that feels like a "gas bubble". Smears of stool overnight, not obviously tarry or bloody per RN.   OBJECTIVE:         Vital signs in last 24 hours:    Temp:  [97.4 F (36.3 C)-97.9 F (36.6 C)] 97.4 F (36.3 C) (07/06 0700) Pulse Rate:  [76-96] 79 (07/06 0700) Resp:  [16-43] 28 (07/06 0700) BP: (69-139)/(24-73) 85/48 mmHg (07/06 0700) SpO2:  [91 %-100 %] 100 % (07/06 0700) Weight:  [251 lb 5.2 oz (114 kg)] 251 lb 5.2 oz (114 kg) (07/06 0500) Last BM Date: 06/10/15 Filed Weights   06/09/15 0401 06/10/15 0500 06/11/15 0500  Weight: 252 lb 13.9 oz (114.7 kg) 252 lb 3.3 oz (114.4 kg) 251 lb 5.2 oz (114 kg)   General: pleasant, comfortable, moderately ill appearing   Heart: RRR. Chest: clear bil in front, no labored breathing Abdomen: soft, NT, ND.  Active BS  Extremities: no CCE.  No bruising or hematoma in thighs.  Neuro/Psych:  Pleasant, more subdued, not anxious or agitated.   Intake/Output from previous day: 07/05 0701 - 07/06 0700 In: 2526.7 [P.O.:720; I.V.:1806.7] Out: 960 [Urine:960]  Intake/Output this shift:    Lab Results:  Recent Labs  06/09/15 2004 06/10/15 0539 06/11/15 0530  WBC 23.9* 27.7* 48.4*  HGB 7.2* 8.0* 6.6*  HCT 22.9* 24.1* 20.4*  PLT 241 261 358   BMET  Recent Labs  06/09/15 2004 06/10/15 0539 06/11/15 0530  NA 132* 131*  131* 132*  K 4.8 5.1  5.1 6.2*  CL 99* 98*  98* 95*  CO2 19* 22  22 15*  GLUCOSE 275* 303*  304* 181*  BUN 91* 107*  107* 143*  CREATININE 3.65* 3.89*  3.93* 5.89*  CALCIUM 7.4* 7.4*  7.4* 8.0*   LFT  Recent Labs  06/09/15 2004 06/10/15 0539 06/11/15 0530  ALBUMIN  2.1* 2.2* 2.2*   PT/INR  Recent Labs  06/10/15 0539 06/11/15 0530  LABPROT 19.5* 30.3*  INR 1.65* 2.96*   Hepatitis Panel No results for input(s): HEPBSAG, HCVAB, HEPAIGM, HEPBIGM in the last 72 hours.  Studies/Results: Dg Chest Port 1 View  06/10/2015   CLINICAL DATA:  Evaluate for evidence of aspiration; currently asymptomatic  EXAM: PORTABLE CHEST - 1 VIEW  COMPARISON:  Portable chest x-ray of June 09, 2015  FINDINGS: The lungs are reasonably well inflated and clear. The cardiac silhouette is mildly enlarged but stable. The pulmonary vascularity is normal. There is a large caliber right internal jugular venous catheter whose tip lies at the level of the junction of the right and left brachiocephalic veins. The left internal jugular venous catheter tip projects in the proximal SVC. There is no pleural effusion or pneumothorax. External pacing/defibrillator pads are present. The bony thorax is unremarkable.  IMPRESSION: There is no evidence of pneumonia. There is enlargement the cardiac silhouette without evidence of pulmonary edema.   Electronically Signed   By: David  SwazilandJordan M.D.   On: 06/10/2015 09:49   Dg Chest Port 1 View  06/09/2015   CLINICAL DATA:  Central line placement.  Initial encounter.  EXAM: PORTABLE  CHEST - 1 VIEW  COMPARISON:  Chest radiograph performed earlier today at 6:51 p.m.  FINDINGS: A new left IJ line is noted ending about the proximal to mid SVC. A right IJ sheath is noted ending about the proximal SVC.  The lungs are well-aerated and clear. There is no evidence of focal opacification, pleural effusion or pneumothorax.  The cardiomediastinal silhouette is mildly enlarged. No acute osseous abnormalities are seen. External pacing pads are noted.  IMPRESSION: 1. New left IJ line noted ending about the proximal to mid SVC. 2. Mild cardiomegaly; lungs remain grossly clear.   Electronically Signed   By: Roanna Raider M.D.   On: 06/09/2015 22:52   Dg Chest Port 1  View  06/09/2015   CLINICAL DATA:  Patient has problems getting comfortable and could not sleep well last night. Orthopnea.  EXAM: PORTABLE CHEST - 1 VIEW  COMPARISON:  June 06, 2015  FINDINGS: The mediastinal contour is normal. The heart size is enlarged. There is no focal infiltrate, pulmonary edema, or pleural effusion. The visualized skeletal structures are unremarkable.  IMPRESSION: Cardiomegaly.  Lungs are clear.   Electronically Signed   By: Sherian Rein M.D.   On: 06/09/2015 20:53   Scheduled Meds: . sodium chloride   Intravenous Once  . aspirin EC  81 mg Oral Daily  . atorvastatin  80 mg Oral q1800  . insulin aspart  0-15 Units Subcutaneous TID WC  . pantoprazole (PROTONIX) IV  40 mg Intravenous Q12H  . ticagrelor  90 mg Oral BID   Continuous Infusions: . amiodarone 30 mg/hr (06/11/15 0100)  . norepinephrine (LEVOPHED) Adult infusion 22 mcg/min (06/10/15 2221)  . dialysis replacement fluid (prismasate)    . dialysis replacement fluid (prismasate)    . dialysate (PRISMASATE)     PRN Meds:.Place/Maintain arterial line **AND** sodium chloride, acetaminophen, alum & mag hydroxide-simeth, heparin, heparin, ondansetron (ZOFRAN) IV, sodium chloride  ASSESMENT:   * UGI bleed: melena, ABL anemia. In setting heparin, coumadin, bivalrudin, .   July 07, 2015 EGD: 1) Gastric erosions and gastritis. 2) Duodenal ulcers and erosions. 3) Duodenal ulcer with visible vessel s/p hemoclipping. Serum H pylori test in process.  On BID IV Protonix, was never on Protonix drip.  Hgb drop from 8 to 6.6. Low iron and iron sats, normal ferritin, on BID oral iron per Dr Elnoria Howard.   * STEMI, S/p RCA stent. Afib/RVR, converted to NSR with amiodarone. 7/4: Code (shocked x 2, lidocaine with ROSC) for pulseless V tach. Remains off Coumadin but rising coags. EF 25 to 30% on 6/29 echo.   * Low density lesion left cerebellum, concerning for infarct of indeterminate age.   * AKI. Now initiating on CRRT.      PLAN   *  Continue BID, IV Protonix. CBC and coags in AM.    Joe Love  06/11/2015, 8:33 AM Pager: [phone]  GI ATTENDING  Interval history and data reviewed. Patient personally seen and examined. Agree with interval progress note as above. Drifting hemoglobin without GI bleeding. Other multisystem issues as noted. No bleeding since endoscopic hemostatic therapy 4 days ago. Continue with twice daily IV PPI throughout entire hospitalization. Upon discharge she should continue on TWICE a day PPI for additional 4 weeks then once daily thereafter INDEFINITELY. 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 Recommend treatment in the form of amoxicillin 1 g twice a day and clarithromycin 500 mg twice a day, each for [redacted] weeks along with PPI. However, would wait until acute medical issues  are resolved before initiating therapy. For any questions or problems free to give [place] a call. We're available as needed. Will sign off  Zenas Santa N. Eda Keys., M.D. [facility] Division of Gastroenterology

## 2015-06-11 NOTE — Progress Notes (Signed)
CRITICAL VALUE ALERT  Critical value received:  wbc  Date of notification:  06/11/2015   Time of notification:  1700  Critical value read back:yes  Nurse who received alert:  Loura BackJennifer Pace  MD notified (1st page):  hilty  Time of first page:  1735  MD notified (2nd page):  Time of second page:  Responding MD:  hilty  Time MD responded:  1740

## 2015-06-11 NOTE — Progress Notes (Signed)
ANTICOAGULATION CONSULT NOTE - Follow Up Consult  Pharmacy Consult for coumadin Indication: apical thrombus  Allergies  Allergen Reactions  . Shrimp [Shellfish Allergy] Shortness Of Breath    Patient Measurements: Height: 5\' 11"  (180.3 cm) Weight: 251 lb 5.2 oz (114 kg) IBW/kg (Calculated) : 75.3  Vital Signs: Temp: 97.4 F (36.3 C) (07/06 0700) Temp Source: Oral (07/06 0700) BP: 85/48 mmHg (07/06 0700) Pulse Rate: 79 (07/06 0700)  Labs:  Recent Labs  06/09/15 0400  06/09/15 2004 06/10/15 0539 06/10/15 1015 06/11/15 0530  HGB 7.4*  --  7.2* 8.0*  --  6.6*  HCT 23.2*  --  22.9* 24.1*  --  20.4*  PLT 170  --  241 261  --  358  LABPROT 17.6*  --   --  19.5*  --  30.3*  INR 1.43  --   --  1.65*  --  2.96*  CREATININE 3.14*  3.12*  < > 3.65* 3.89*  3.93*  --  5.89*  TROPONINI  --   --   --   --  18.48*  --   < > = values in this interval not displayed.  Estimated Creatinine Clearance: 18.4 mL/min (by C-G formula based on Cr of 5.89).  Assessment: 54 y/o male with h/o DM, HTN and HLD, but no prior cardiac history admitted for acute STEMI  Anticoagulation: afib and apical thrombus seen on ECHO. Coumadin added 6/30. 7/1 2nd Hep/warf d/ced d/t large bloody stool, GI - endoscopy erosions inj with epi.   Warfarin given last night and INR increased this am 1.65>>2.96. Hgb also dropped from 8 to 6.6. Orders to hold warfarin for now. Will continue to follow peripherally.  2 units of PRBC's have been ordered, CRRT to start again this am per renal.  Goal of Therapy:  INR 2-3(aim for low end given recent bleed) Monitor platelets by anticoagulation protocol: Yes   Plan:  Hold warfarin for now Daily INR  Sheppard CoilFrank Wilson PharmD., BCPS Clinical Pharmacist Pager [phone] 06/11/2015 8:29 AM

## 2015-06-11 NOTE — Progress Notes (Signed)
eLink Physician-Brief Progress Note Patient Name: Joe Love DOB: [birth year] MRN: [id number]   Date of Service  06/11/2015  HPI/Events of Note  Increasing WBC and increasing pressor requirement with increasing procalcitonin   eICU Interventions  IVFs, broad spectrum antibiotics.      Intervention Category Major Interventions: Hypotension - evaluation and management  Deby Adger 06/11/2015, 6:06 PM

## 2015-06-12 ENCOUNTER — Inpatient Hospital Stay (HOSPITAL_COMMUNITY): Payer: Medicaid Other

## 2015-06-12 ENCOUNTER — Encounter (HOSPITAL_COMMUNITY): Payer: Self-pay

## 2015-06-12 DIAGNOSIS — K269 Duodenal ulcer, unspecified as acute or chronic, without hemorrhage or perforation: Secondary | ICD-10-CM

## 2015-06-12 DIAGNOSIS — E871 Hypo-osmolality and hyponatremia: Secondary | ICD-10-CM

## 2015-06-12 DIAGNOSIS — I639 Cerebral infarction, unspecified: Secondary | ICD-10-CM | POA: Insufficient documentation

## 2015-06-12 DIAGNOSIS — I6340 Cerebral infarction due to embolism of unspecified cerebral artery: Secondary | ICD-10-CM | POA: Diagnosis not present

## 2015-06-12 DIAGNOSIS — J9600 Acute respiratory failure, unspecified whether with hypoxia or hypercapnia: Secondary | ICD-10-CM

## 2015-06-12 DIAGNOSIS — E785 Hyperlipidemia, unspecified: Secondary | ICD-10-CM

## 2015-06-12 DIAGNOSIS — K264 Chronic or unspecified duodenal ulcer with hemorrhage: Secondary | ICD-10-CM

## 2015-06-12 DIAGNOSIS — D72829 Elevated white blood cell count, unspecified: Secondary | ICD-10-CM

## 2015-06-12 DIAGNOSIS — E118 Type 2 diabetes mellitus with unspecified complications: Secondary | ICD-10-CM

## 2015-06-12 DIAGNOSIS — I6350 Cerebral infarction due to unspecified occlusion or stenosis of unspecified cerebral artery: Secondary | ICD-10-CM

## 2015-06-12 DIAGNOSIS — E875 Hyperkalemia: Secondary | ICD-10-CM

## 2015-06-12 DIAGNOSIS — J9601 Acute respiratory failure with hypoxia: Secondary | ICD-10-CM

## 2015-06-12 LAB — PROTIME-INR
INR: 3.44 — ABNORMAL HIGH (ref 0.00–1.49)
Prothrombin Time: 33.9 seconds — ABNORMAL HIGH (ref 11.6–15.2)

## 2015-06-12 LAB — BLOOD GAS, ARTERIAL
Acid-base deficit: 11.9 mmol/L — ABNORMAL HIGH (ref 0.0–2.0)
BICARBONATE: 14.1 meq/L — AB (ref 20.0–24.0)
FIO2: 0.8 %
O2 Saturation: 98.8 %
PATIENT TEMPERATURE: 98.6
PEEP: 5 cmH2O
PH ART: 7.232 — AB (ref 7.350–7.450)
RATE: 18 resp/min
TCO2: 15.2 mmol/L (ref 0–100)
VT: 500 mL
pCO2 arterial: 34.8 mmHg — ABNORMAL LOW (ref 35.0–45.0)
pO2, Arterial: 172 mmHg — ABNORMAL HIGH (ref 80.0–100.0)

## 2015-06-12 LAB — CBC
HCT: 29.7 % — ABNORMAL LOW (ref 39.0–52.0)
Hemoglobin: 9.9 g/dL — ABNORMAL LOW (ref 13.0–17.0)
MCH: 25.8 pg — AB (ref 26.0–34.0)
MCHC: 33.3 g/dL (ref 30.0–36.0)
MCV: 77.5 fL — AB (ref 78.0–100.0)
PLATELETS: ADEQUATE 10*3/uL (ref 150–400)
RBC: 3.83 MIL/uL — ABNORMAL LOW (ref 4.22–5.81)
RDW: 21 % — AB (ref 11.5–15.5)
WBC: 52.5 10*3/uL (ref 4.0–10.5)

## 2015-06-12 LAB — HEPATIC FUNCTION PANEL
ALT: 1182 U/L — ABNORMAL HIGH (ref 17–63)
AST: 1926 U/L — ABNORMAL HIGH (ref 15–41)
Albumin: 2.1 g/dL — ABNORMAL LOW (ref 3.5–5.0)
Alkaline Phosphatase: 217 U/L — ABNORMAL HIGH (ref 38–126)
BILIRUBIN TOTAL: 1.5 mg/dL — AB (ref 0.3–1.2)
Bilirubin, Direct: 0.7 mg/dL — ABNORMAL HIGH (ref 0.1–0.5)
Indirect Bilirubin: 0.8 mg/dL (ref 0.3–0.9)
Total Protein: 4.5 g/dL — ABNORMAL LOW (ref 6.5–8.1)

## 2015-06-12 LAB — TYPE AND SCREEN
ABO/RH(D): O POS
Antibody Screen: NEGATIVE
UNIT DIVISION: 0
UNIT DIVISION: 0
Unit division: 0
Unit division: 0
Unit division: 0
Unit division: 0

## 2015-06-12 LAB — GLUCOSE, CAPILLARY
GLUCOSE-CAPILLARY: 131 mg/dL — AB (ref 65–99)
Glucose-Capillary: 195 mg/dL — ABNORMAL HIGH (ref 65–99)
Glucose-Capillary: 160 mg/dL — ABNORMAL HIGH (ref 65–99)
Glucose-Capillary: 210 mg/dL — ABNORMAL HIGH (ref 65–99)

## 2015-06-12 LAB — MAGNESIUM: Magnesium: 2.6 mg/dL — ABNORMAL HIGH (ref 1.7–2.4)

## 2015-06-12 LAB — RENAL FUNCTION PANEL
Albumin: 2.1 g/dL — ABNORMAL LOW (ref 3.5–5.0)
Anion gap: 20 — ABNORMAL HIGH (ref 5–15)
BUN: 92 mg/dL — ABNORMAL HIGH (ref 6–20)
CALCIUM: 6.5 mg/dL — AB (ref 8.9–10.3)
CO2: 14 mmol/L — AB (ref 22–32)
CREATININE: 4.25 mg/dL — AB (ref 0.61–1.24)
Chloride: 99 mmol/L — ABNORMAL LOW (ref 101–111)
GFR calc non Af Amer: 15 mL/min — ABNORMAL LOW (ref >60)
GFR, EST AFRICAN AMERICAN: 17 mL/min — AB (ref >60)
GLUCOSE: 163 mg/dL — AB (ref 65–99)
Phosphorus: 8.9 mg/dL — ABNORMAL HIGH (ref 2.5–4.6)
Potassium: 4.9 mmol/L (ref 3.5–5.1)
Sodium: 133 mmol/L — ABNORMAL LOW (ref 135–145)

## 2015-06-12 LAB — PROCALCITONIN: Procalcitonin: 12.03 ng/mL

## 2015-06-12 MED ORDER — SODIUM BICARBONATE 8.4 % IV SOLN
INTRAVENOUS | Status: AC
  Administered 2015-06-12: 50 meq
  Filled 2015-06-12: qty 50

## 2015-06-12 MED ORDER — EPINEPHRINE HCL 1 MG/ML IJ SOLN
0.5000 ug/min | INTRAVENOUS | Status: DC
  Administered 2015-06-12: 40 ug/min via INTRAVENOUS
  Administered 2015-06-12: 0.5 ug/min via INTRAVENOUS
  Administered 2015-06-12: 40 ug/min via INTRAVENOUS
  Filled 2015-06-12 (×6): qty 4

## 2015-06-12 MED ORDER — EPINEPHRINE HCL 0.1 MG/ML IJ SOSY
PREFILLED_SYRINGE | INTRAMUSCULAR | Status: AC
  Administered 2015-06-12: 1 mg
  Filled 2015-06-12: qty 10

## 2015-06-12 MED ORDER — MIDAZOLAM HCL 2 MG/2ML IJ SOLN: 2.0000 mg | INTRAMUSCULAR | Status: DC | PRN

## 2015-06-12 MED ORDER — CHLORHEXIDINE GLUCONATE 0.12 % MT SOLN
15.0000 mL | Freq: Two times a day (BID) | OROMUCOSAL | Status: DC
  Administered 2015-06-12: 15 mL via OROMUCOSAL

## 2015-06-12 MED ORDER — EPINEPHRINE HCL 0.1 MG/ML IJ SOSY
PREFILLED_SYRINGE | INTRAMUSCULAR | Status: AC
  Administered 2015-06-12 (×2): 0.5 mg
  Filled 2015-06-12: qty 10

## 2015-06-12 MED ORDER — MIDAZOLAM HCL 2 MG/2ML IJ SOLN
INTRAMUSCULAR | Status: AC
  Administered 2015-06-12: 2 mg
  Filled 2015-06-12: qty 2

## 2015-06-12 MED ORDER — CHLORHEXIDINE GLUCONATE 0.12 % MT SOLN
OROMUCOSAL | Status: AC
  Administered 2015-06-12: 06:00:00
  Filled 2015-06-12: qty 15

## 2015-06-12 MED ORDER — LIDOCAINE HCL (CARDIAC) 20 MG/ML IV SOLN
INTRAVENOUS | Status: AC
  Filled 2015-06-12: qty 5

## 2015-06-12 MED ORDER — SUCCINYLCHOLINE CHLORIDE 20 MG/ML IJ SOLN
INTRAMUSCULAR | Status: AC
  Filled 2015-06-12: qty 1

## 2015-06-12 MED ORDER — ROCURONIUM BROMIDE 50 MG/5ML IV SOLN
INTRAVENOUS | Status: AC
  Filled 2015-06-12: qty 2

## 2015-06-12 MED ORDER — CHLORHEXIDINE GLUCONATE 0.12 % MT SOLN
OROMUCOSAL | Status: AC
  Filled 2015-06-12: qty 15

## 2015-06-12 MED ORDER — STROKE: EARLY STAGES OF RECOVERY BOOK
Freq: Once | Status: DC
  Filled 2015-06-12: qty 1

## 2015-06-12 MED ORDER — ETOMIDATE 2 MG/ML IV SOLN
INTRAVENOUS | Status: AC
  Administered 2015-06-12: 20 mg
  Filled 2015-06-12: qty 20

## 2015-06-12 MED ORDER — CETYLPYRIDINIUM CHLORIDE 0.05 % MT LIQD: 7.0000 mL | Freq: Four times a day (QID) | OROMUCOSAL | Status: DC

## 2015-06-12 MED ORDER — FENTANYL CITRATE (PF) 100 MCG/2ML IJ SOLN: 100.0000 ug | INTRAMUSCULAR | Status: DC | PRN

## 2015-06-12 MED ORDER — FENTANYL CITRATE (PF) 100 MCG/2ML IJ SOLN
INTRAMUSCULAR | Status: AC
  Administered 2015-06-12: 100 ug
  Filled 2015-06-12: qty 2

## 2015-06-12 MED ORDER — EPINEPHRINE HCL 0.1 MG/ML IJ SOSY
PREFILLED_SYRINGE | INTRAMUSCULAR | Status: AC
  Administered 2015-06-12: 0.5 mg
  Filled 2015-06-12: qty 20

## 2015-06-12 MED ORDER — PHENYLEPHRINE HCL 10 MG/ML IJ SOLN
0.0000 ug/min | INTRAVENOUS | Status: DC
  Administered 2015-06-12 (×2): 400 ug/min via INTRAVENOUS
  Administered 2015-06-12: 100 ug/min via INTRAVENOUS
  Filled 2015-06-12 (×7): qty 4

## 2015-06-22 MED FILL — Medication: Qty: 1 | Status: AC

## 2015-07-07 NOTE — Progress Notes (Signed)
Patient ID: Ebbie Ridgeerry Dibbern, male   DOB: [birth year], 54 y.o.   MRN: [id number] Events from overnight noted most significantly for development of acute CVA, critical hypotension requiring epinephrine boluses and need for intubation/ventilator support for respiratory failure. Additionally, lab significant for shock liver. Discussed with Dr. Rennis GoldenHilty regarding the patient's acute changes and noted his plans for transitioning to comfort care.  CRRT at this time prohibited by critical hypotension. Goals of care noted to gear towards comfort care. We'll sign off at this time-please call if I can be of further assistance.  Zetta BillsJay Jonquil Stubbe MD [facility]. Office # [phone] Pager # [phone] 8:42 AM

## 2015-07-07 NOTE — Progress Notes (Signed)
Assisted with intubation with Dr. Marchelle Gearingamaswamy due to patients declining respiratory status.  Patient intubated with a 7.5 tube and secured with a commercial tube holder 22 @ lip and is secured on the right sid eof the patients mouth at this time.  Will continue to monitor patient.

## 2015-07-07 NOTE — Progress Notes (Signed)
0500 pt unable to move left arm and leg. Speech is slurred. Dr Amada JupiterKirkpatrick made aware and came to bedside. RR increased 44, sats 60's on 4L [place]. NRB placed and sats increased 100%. CCM made aware and came to bedside.

## 2015-07-07 NOTE — Consult Note (Signed)
Neurology Consultation Reason for Consult: Stroke Referring Physician: Dayle Pointsooper, M  CC: Left Sided weakness  History is obtained from:patient  HPI: Joe Love is a 54 y.o. male with a  histtory of recent severe MI with apical thrombus who was last seen in his normal state at 2:45 am. He was subsequently found less responsive with severe left sided weakness and a code stroke was activiated. He was initially completely plegic on the left side, though more recently has had some return of function. He has recently had GI bleed while anticoagulated and therefore    LKW: 2:45 am tpa given?: no, recent GI bleed, recent MI    ROS: Unable to obtian due to SOB and acute nature of illness.   Past Medical History  Diagnosis Date  . Hypertension   . Hyperlipidemia   . Diabetes mellitus without complication     Family History: CAD, kidney disease  Social History: Tob: never smoker  Exam: Current vital signs: BP 94/62 mmHg  Pulse 76  Temp(Src) 96 F (35.6 C) (Axillary)  Resp 41  Ht 5\' 11"  (1.803 m)  Wt 114 kg (251 lb 5.2 oz)  BMI 35.07 kg/m2  SpO2 100% Vital signs in last 24 hours: Temp:  [93.6 F (34.2 C)-97.4 F (36.3 C)] 96 F (35.6 C) (07/07 0200) Pulse Rate:  [67-80] 76 (07/06 2330) Resp:  [16-45] 41 (07/07 0300) BP: (63-113)/(22-81) 94/62 mmHg (07/07 0300) SpO2:  [97 %-100 %] 100 % (07/07 0006) Weight:  [114 kg (251 lb 5.2 oz)] 114 kg (251 lb 5.2 oz) (07/06 0500)  Physical Exam  Constitutional: Appears slightly overweight Psych: Affect appropriate to situation Eyes: No scleral injection HENT: No OP obstrucion Head: Normocephalic.  Cardiovascular: tachycardic  Respiratory: slightly labored breathing, though improving.  GI: Soft.  No distension. There is no tenderness.  Skin: WDI  Neuro: Mental Status: Patient is awake, alert, oriented to person, month year Patient is able to give a clear and coherent history. No signs of aphasia or neglect Cranial  Nerves: II: Visual Fields are full. No visual neglaect. Pupils are equal, round, and reactive to light.   III,IV, VI: ? Limited movement to the left, full version to the right  V: Facial sensation is symmetric to temperature VII: Facial movement is very mildly weak on the left VIII: hearing is intact to voice X: Uvula elevates symmetrically XI: Shoulder shrug is symmetric. XII: tongue is midline without atrophy or fasciculations.  Motor: Tone is normal. Bulk is normal. Good strength without drift was present on the right. He has 4-/5 strength in the left arm and 3/5 strength in the left leg, improved from 2/5 on my arrival at the bedside(over the course of 10 minute) Sensory: Sensation is symmetric to light touch and temperature in the arms and legs. Plantars: Toes are downgoing bilaterally.  Cerebellar: Possible mild difficulty with right FNF, though unclear if due to generalized weakness.   I have reviewed labs in epic and the results pertinent to this consultation are: Elevated creatinine A1C- 9.9 LDL 57.   I have reviewed the images obtained: CT head -  No acute IC findings, old stroke.   Impression: 54 yo M with acute stroke in the setting of apical thrombus due to recent MI, also with GI bleed this admission. He is on dual antiplatelet(asa+ticagrelor) therapy rather than anticoagulation due to GI bleeding. In this setting , he is not an IV tpa candidate. Also, with his improving symptoms, I do not think he would likely  be an intraarterial interventional stroke candidate either. In that setting, would hold off on CTA at this time.   Recommendations: 1. MRI, MRA  of the brain without contrast 2. Frequent neuro checks 3. Carotid dopplers 4. Prophylactic therapy-Antiplatelet therapy: asa+ticagrelor 5. Risk factor modification 6. Telemetry monitoring 7. PT consult, OT consult, Speech consult once more stable    Ritta Slot, MD [facility] [phone]  If 7pm- 7am, please page neurology on call as listed in AMION.

## 2015-07-07 NOTE — Progress Notes (Signed)
Spoke with Dr. Tenny Crawoss Cardiologist on call about pt map. Dr. Tenny Crawoss went to see pt. Advised to increase levo to 45mcg and not to pull fluid off for now until map increase to 60. Will continue to monitor.

## 2015-07-07 NOTE — Procedures (Signed)
Arterial Catheter Insertion Procedure Note Ebbie Ridgeerry Pinela [id number] 14-May-1961  Procedure: Insertion of Arterial Catheter  Indications: Blood pressure monitoring  Procedure Details Consent: Unable to obtain consent because of emergent medical necessity. Time Out: Verified patient identification, verified procedure, site/side was marked, verified correct patient position, special equipment/implants available, medications/allergies/relevent history reviewed, required imaging and test results available.  Performed  Maximum sterile technique was used including antiseptics, cap, gloves, gown, hand hygiene, mask and sheet. Skin prep: Chlorhexidine; local anesthetic administered 20 gauge catheter was inserted into right femoral artery using the Seldinger technique.  Evaluation Blood flow good; BP tracing good. Complications: No apparent complications.   Joneen RoachPaul Hoffman, AGACNP-BC [facility] Pager [phone] or [phone]  07/04/2015 6:28 AM

## 2015-07-07 NOTE — Procedures (Signed)
Intubation Procedure Note Joe Love [id number] February 11, 1961  Procedure: Intubation Indications: Respiratory insufficiency  Procedure Details Consent: Risks of procedure as well as the alternatives and risks of each were explained to the (patient/caregiver).  Consent for procedure obtained. Time Out: Verified patient identification, verified procedure, site/side was marked, verified correct patient position, special equipment/implants available, medications/allergies/relevent history reviewed, required imaging and test results available.  Performed  Maximum sterile technique was used including antiseptics, cap, gloves, gown, hand hygiene and mask.  MAC    Evaluation Hemodynamic Status: Persistent hypotension treated with pressors; O2 sats: stable throughout Patient's Current Condition: unstable Complications: No apparent complications Patient did not tolerate procedure well. Chest X-ray ordered to verify placement.  CXR: pending.   Joe Love 06/20/2015  glidescope 4 blade used. Easy intubation. Patient in cardiogenic shock on near max levophed and in renal failure and now acute stroke in need of airway protetction. Family,neuro and cardiology all understand very high risk for cardiac arrest with intubation in this setting. But benefit outweighed risk.Pre-emptive bicarb given. Postintubation SBP dropped to 35-45 sbp and needed to give epi x 1   Dr. Kalman ShanMurali Breckin Savannah, M.D., [facility].P Pulmonary and Critical Care Medicine Staff Physician [facility] [place] [facility] Pager: [phone], If no answer or between  15:00h - 7:00h: call [phone]  07/04/2015 6:12 AM

## 2015-07-07 NOTE — Progress Notes (Signed)
RN went into room found pt unable to make sentences when asked simple question. Pt was able to follow commands but unable to move left arm and left leg. Dr Excell Seltzerooper oncall made aware and Rapid Response was also made aware, both came to bedside.

## 2015-07-07 NOTE — Progress Notes (Signed)
RT attempted to place Joe Love per MD order. Patient was stuck twice, and both times we obtain great blood flow was not able to thread the catheter. RN and CCM notified

## 2015-07-07 NOTE — Discharge Summary (Signed)
Discharge Summary   Patient ID: Joe Love,  MRN: [id number], DOB/AGE: Sep 16, 1961 54 y.o.  Admit date: 20-Jun-2015 Discharge date: 07/01/2015  Primary Care Provider: Elvina Sidle Primary Cardiologist: Bishop Limbo, MD   Discharge Diagnoses Principal Problem:   ST elevation myocardial infarction (STEMI) involving right coronary artery with complication Active Problems:   Acute renal failure syndrome   Acute systolic CHF (congestive heart failure)   Cardiogenic shock   Respiratory failure   Ventricular tachycardia   CAD (coronary artery disease)   Cardiomyopathy, ischemic   Diabetes mellitus type 2 with complications   PAF (paroxysmal atrial fibrillation)   Upper GI bleed   Gastritis   Duodenal ulcer disease   Anemia of renal disease   LV (left ventricular) mural thrombus following MI   Metabolic acidosis   Hyperlipidemia   Hyposmolality and/or hyponatremia   Hyperkalemia   Leukocytosis   Duodenal ulcer hemorrhage   Acute blood loss anemia   Acute respiratory failure with hypoxia   Cerebral embolism with cerebral infarction   Stroke  Allergies Allergies  Allergen Reactions  . Shrimp [Shellfish Allergy] Shortness Of Breath    Procedures  Cardiac Catheterization and Percutaneous Coronary Intervention 2015-06-20  Coronary Findings     Dominance: Right    Left Anterior Descending   . Dist LAD lesion, 100% stenosed.      Left Circumflex   . Mid Cx lesion, 100% stenosed.   . First Obtuse [place]   The vessel is small in size.   Marland Kitchen [employer]   . 2nd Mrg lesion, 100% stenosed.      Right Coronary Artery  Dist RCA filled by collaterals from 2nd Sept.   . Prox RCA lesion, 100% stenosed.   Marland Kitchen PCI: 3.0 x 38 Xience Alpine DES placed.     . Mid RCA to Dist RCA lesion, 80% stenosed.   . Dist RCA lesion, 85% stenosed. diffuse .   Marland Kitchen PCI: PTCA only.  . There is a 30% residual stenosis post intervention.     . Right Posterior Descending Artery    . Ost RPDA lesion, 80% stenosed.   Marland Kitchen PCI: PTCA only.  . There is a 40% residual stenosis post intervention.      _____________   2D Echocardiogram 6.29.2016  Study Conclusions  - Left ventricle: The cavity size was normal. Systolic function was   severely reduced. The estimated ejection fraction was in the   range of 25% to 30%. Doppler parameters are consistent with   elevated ventricular end-diastolic filling pressure. - Aortic valve: Trileaflet; normal thickness leaflets. There was no   regurgitation. - Aortic root: The aortic root was normal in size. - Mitral valve: Calcified annulus. Mildly thickened leaflets .   There was trivial regurgitation. - Left atrium: The atrium was mildly dilated. - Right ventricle: The cavity size was normal. Wall thickness was   normal. Systolic function was normal. - Tricuspid valve: There was mild regurgitation. - Pulmonary arteries: The main pulmonary artery was normal-sized.   Systolic pressure was within the normal range. - Inferior vena cava: The vessel was normal in size. The   respirophasic diameter changes were in the normal range (>= 50%),   consistent with normal central venous pressure. - Pericardium, extracardiac: There was no pericardial effusion. _____________   Renal Ultrasound 6.29.2016  IMPRESSION: Normal renal ultrasound. No evidence of hydronephrosis. Bladder poorly visualized. _____________   EGD 7.2.2016  ENDOSCOPIC IMPRESSION: 1) Gastric erosions and gastritis. 2) Duodenal ulcers and erosions.  3) Duodenal ulcer with visible vessel s/p hemoclipping. _____________   Head CT 7.7.2016  IMPRESSION: 1. No acute finding. 2. 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 3. Motion degraded study. _____________   History of Present Illness  54 y/o male with a h/o DM, HTN, and hyperlipidemia.  He presented to the [facility] ED on 6/28 with complaints of malaise and nausea.  He was noted to have a markedly  elevated creatinine of 5.94 and a potassium of 6.5.  ECG showed acute inferior ST elevations and a Code STEMI was called.  Patient was taken to the [facility] Cath lab for emergent catheterization.  Hospital Course  Cath was performed and revealed probable old LAD and LCX occlusions along with a new RCA occlusion that was successfully treated with DES x 2. Patient was admitted to the coronary intensive care unit and he ruled in for MI and eventually peaked his troponin at >65.00.  He remained hypotensive post-procedure and required ongoing vasopressor therapy.  Echo was performed and showed an EF of 25-30%.  There was evidence of LV apical thrombus and coumadin was added to asa/brilinta. He had progressive renal failure and required nephrology evaluation and eventual CRRT on 7/1 and 7/2. Renal ultrasound showed no hydronephrosis.  Though his condition initially stabilized, he developed melena and anemia on 7/1.  He was placed on IV protonix and coumadin therapy was held.  GI was consulted and  cute upper GIB s/p EGD on 7/2 revealing gastritis, gastric/duodenal erosions, and duodenal ulcer s/p hemoclipping.  In the setting of GIB, he required 6 units of PRBC's.  He also developed intermittent atrial fibrillation requiring the initiation of IV amiodarone.  On the evening of 7/4, pt developed frequent runs of NSVT followed by VT requiring defibrillation and an IV lidocaine bolus.  He was stable on the morning of 7/5, though he still required IV levophed.  He was noted to have significant leukocytosis with a rise in his WBC's to 27.7.  CXR did not show any pneumonia.  By 7/6, his renal function worsened with a BUN of 143 and creat of 5.89.  It was felt that he would require CRRT.  WBC rose to 48.4 on the morning of 7/6 and later spiked to >55K.  Patient remained afebrile.  Broad spectrum antibiotics were added.  Pts hemodynamic status remained stable on vasopressor therapy however in the early morning hours of 7/7, he  developed tachypnea and altered responsiveness associated with left sided paralysis.  A code stroke was called and CT of the brain was negative for acute bleed, though clinically, it was felt that patient had had a stroke.  He was not felt to be a TPA candidate secondary to ongoing asa/plavix and coumadin therapy.  Respiratory status remained poor and he required intubation.  He was not felt to be stable for MRI/MRA.  He continued to require multiple vasopressors and epinephrine boluses.  A family meeting was held and decision was made to pursue comfort care.  He was made a DNR and aggressive therapies were withdrawn.  On 7/7 at 12:26, he became asystolic and was pronounced dead.  Final Vitals Blood pressure 49/28, pulse 51, temperature 98.7 F (37.1 C), temperature source Oral, resp. rate 18, height 6' (1.829 m), weight 251 lb 5.2 oz (114 kg), SpO2 100 %.  Filed Weights   06/10/15 0500 06/11/15 0500 07/05/2015 1241  Weight: 252 lb 3.3 oz (114.4 kg) 251 lb 5.2 oz (114 kg) 251 lb 5.2 oz (114 kg)  Labs  CBC  Recent Labs  06/11/15 1631 06/13/2015 0350  WBC 55.8* 52.5*  HGB 9.6* 9.9*  HCT 28.2* 29.7*  MCV 76.2* 77.5*  PLT 342 PLATELET CLUMPS NOTED ON SMEAR, COUNT APPEARS ADEQUATE   Basic Metabolic Panel  Recent Labs  06/11/15 0530 06/11/15 1630 06/09/2015 0350  NA 132* 133* 133*  K 6.2* 5.2* 4.9  CL 95* 97* 99*  CO2 15* 16* 14*  GLUCOSE 181* 228* 163*  BUN 143* 118* 92*  CREATININE 5.89* 4.83* 4.25*  CALCIUM 8.0* 7.6* 6.5*  MG 2.9*  --  2.6*  PHOS 11.1* 9.1* 8.9*   Liver Function Tests  Recent Labs  06/11/15 1630 06/30/2015 0350  AST  --  1926*  ALT  --  1182*  ALKPHOS  --  217*  BILITOT  --  1.5*  PROT  --  4.5*  ALBUMIN 2.4* 2.1*  2.1*   Cardiac Enzymes  Recent Labs  06/10/15 1015  TROPONINI 18.48*   Hemoglobin A1C Lab Results  Component Value Date   HGBA1C 9.9* 06/05/2015    Fasting Lipid Panel Lab Results  Component Value Date   CHOL 108 06/05/2015    HDL 27* 06/05/2015   LDLCALC 57 06/05/2015   TRIG 121 06/05/2015   CHOLHDL 4.0 06/05/2015    Thyroid Function Tests Lab Results  Component Value Date   TSH 1.196 06/05/2015     Outstanding Labs/Studies  None  Duration of Death Summary Encounter   Greater than 60 minutes including physician time.  Signed, Nicolasa Duckinghristopher Damita Eppard NP 06/19/2015, 4:07 PM

## 2015-07-07 NOTE — Progress Notes (Signed)
Nutrition Brief Note  Chart reviewed. BP: 49/28 on max pressors.  Pt intubated this am and per MD not likely to survive.  No nutrition interventions warranted at this time.  Please re-consult as needed.   Kendell BaneHeather Mehul Rudin RD, LDN, CNSC [phone] Pager [phone] After Hours Pager

## 2015-07-07 NOTE — Progress Notes (Signed)
Made oncall Renal MD aware of situation. CRRT was turned off at 03:15 due to patients change in level of consciousness. Advised to turn CRRT machine back on. Will continue to monitor.

## 2015-07-07 NOTE — Progress Notes (Signed)
eLink Physician-Brief Progress Note Patient Name: Joe Love DOB: 06/15/1961 MRN: [id number]   Date of Service  07/06/2015  HPI/Events of Note  Bedside nurse requests A-line placement.   eICU Interventions  Will order A-line placement.      Intervention Category Major Interventions: Hypotension - evaluation and management  Hoang Pettingill Eugene 06/25/2015, 2:08 AM

## 2015-07-07 NOTE — Progress Notes (Signed)
Pt asystole at 1226. Auscultated heart and lung sounds for 2 mins with Verdia KubaPam Lamarr Feenstra RN. No heart or lung sounds auscultated. Paged MD and informed of time of death. Family at bedside offered support. Will continue to support the family.

## 2015-07-07 NOTE — Progress Notes (Signed)
STROKE TEAM PROGRESS NOTE   SUBJECTIVE (INTERVAL HISTORY) His multiple family members are at the bedside.  Overall he feels his condition is rapidly worsening. Currently he is on maximized on pressors but BP still at 40s by cuff and at 80s by A-line. He is unresponsive now with diminished brainstem reflexes, not breathing over the vent. Very poor prognosis.   OBJECTIVE Temp:  [93.6 F (34.2 C)-99 F (37.2 C)] 99 F (37.2 C) (07/07 0732) Pulse Rate:  [67-122] 79 (07/07 1000) Cardiac Rhythm:  [-] Normal sinus rhythm (07/07 0800) Resp:  [16-45] 18 (07/07 1100) BP: (43-137)/(13-109) 49/28 mmHg (07/07 0828) SpO2:  [83 %-100 %] 100 % (07/07 1000) Arterial Line BP: (70-167)/(28-85) 70/28 mmHg (07/07 1100) FiO2 (%):  [80 %-100 %] 100 % (07/07 0828)   Recent Labs Lab 06/11/15 1109 06/11/15 1609 06/11/15 2234 07/04/2015 0312 06/20/2015 0728  GLUCAP 171* 218* 195* 160* 131*    Recent Labs Lab 06/09/15 0400  06/09/15 2004 06/10/15 0539 06/11/15 0530 06/11/15 1630 06/18/2015 0350  NA 133*  133*  < > 132* 131*  131* 132* 133* 133*  K 3.7  3.8  < > 4.8 5.1  5.1 6.2* 5.2* 4.9  CL 98*  97*  < > 99* 98*  98* 95* 97* 99*  CO2 25  26  < > 19* 22  22 15* 16* 14*  GLUCOSE 207*  209*  < > 275* 303*  304* 181* 228* 163*  BUN 58*  57*  < > 91* 107*  107* 143* 118* 92*  CREATININE 3.14*  3.12*  < > 3.65* 3.89*  3.93* 5.89* 4.83* 4.25*  CALCIUM 7.2*  7.2*  < > 7.4* 7.4*  7.4* 8.0* 7.6* 6.5*  MG 2.2  --  2.3 2.6* 2.9*  --  2.6*  PHOS 3.7  < > 5.2* 5.5* 11.1* 9.1* 8.9*  < > = values in this interval not displayed.  Recent Labs Lab 06/09/15 2004 06/10/15 0539 06/11/15 0530 06/11/15 1630 07/06/2015 0350  AST  --   --   --   --  1926*  ALT  --   --   --   --  1182*  ALKPHOS  --   --   --   --  217*  BILITOT  --   --   --   --  1.5*  PROT  --   --   --   --  4.5*  ALBUMIN 2.1* 2.2* 2.2* 2.4* 2.1*  2.1*    Recent Labs Lab 06/06/15 1600 06/06/15 2200  06/09/15 2004  06/10/15 0539 06/11/15 0530 06/11/15 1631 06/06/2015 0350  WBC 15.2* 13.8*  < > 23.9* 27.7* 48.4* 55.8* 52.5*  NEUTROABS 12.6* 11.5*  --   --   --   --   --   --   HGB 8.8* 8.2*  < > 7.2* 8.0* 6.6* 9.6* 9.9*  HCT 27.9* 25.4*  < > 22.9* 24.1* 20.4* 28.2* 29.7*  MCV 65.6* 64.6*  < > 73.6* 74.2* 75.3* 76.2* 77.5*  PLT 211 188  < > 241 261 358 342 PLATELET CLUMPS NOTED ON SMEAR, COUNT APPEARS ADEQUATE  < > = values in this interval not displayed.  Recent Labs Lab 06/10/15 1015  TROPONINI 18.48*    Recent Labs  06/10/15 0539 06/11/15 0530 06/08/2015 0350  LABPROT 19.5* 30.3* 33.9*  INR 1.65* 2.96* 3.44*    Recent Labs  06/10/15 1730  COLORURINE YELLOW  LABSPEC 1.014  PHURINE 5.0  GLUCOSEU 500*  HGBUR NEGATIVE  BILIRUBINUR NEGATIVE  KETONESUR NEGATIVE  PROTEINUR NEGATIVE  UROBILINOGEN 0.2  NITRITE NEGATIVE  LEUKOCYTESUR TRACE*       Component Value Date/Time   CHOL 108 06/05/2015 0500   TRIG 121 06/05/2015 0500   HDL 27* 06/05/2015 0500   CHOLHDL 4.0 06/05/2015 0500   VLDL 24 06/05/2015 0500   LDLCALC 57 06/05/2015 0500   Lab Results  Component Value Date   HGBA1C 9.9* 06/05/2015   No results found for: LABOPIA, COCAINSCRNUR, LABBENZ, AMPHETMU, THCU, LABBARB  No results for input(s): ETH in the last 168 hours.  I have personally reviewed the radiological images below and agree with the radiology interpretations.  Ct Head Wo Contrast  06/29/2015   CLINICAL DATA:  30 minutes of left-sided weakness.  EXAM: CT HEAD WITHOUT CONTRAST  TECHNIQUE: Contiguous axial images were obtained from the base of the skull through the vertex without intravenous contrast.  COMPARISON:  06/05/2015  FINDINGS: Skull and Sinuses:Negative for fracture or destructive process. The mastoids, middle ears, and imaged paranasal sinuses are clear.  Orbits: No acute abnormality.  Brain: Low-density in the peripheral left cerebellum is stable from previous and remains consistent with a small vessel  infarct. No indication of new infarction. ASPECTS is 10. No intracranial hemorrhage, hydrocephalus, or mass lesion.  IMPRESSION: 1. No acute finding. 2. 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 3. Motion degraded study.   Electronically Signed   By: Marnee Spring M.D.   On: 06/30/2015 04:38   Ct Head Wo Contrast  06/05/2015   CLINICAL DATA:  Head injury after fall.  On Coumadin.  EXAM: CT HEAD WITHOUT CONTRAST  TECHNIQUE: Contiguous axial images were obtained from the base of the skull through the vertex without intravenous contrast.  COMPARISON:  None.  FINDINGS: Bony calvarium appears intact. No mass effect or midline shift is noted. Ventricular size is within normal limits. There is no evidence of hemorrhage. Focal low density is seen peripherally in the left cerebellar hemisphere most consistent with infarction of indeterminate age.  IMPRESSION: Focal low density seen peripherally in left cerebellar hemisphere concerning for infarction of indeterminate age. MRI is recommended for further evaluation.   Electronically Signed   By: Lupita Raider, M.D.   On: 06/05/2015 21:45   US Renal  06/04/2015   CLINICAL DATA:  Renal failure.  Initial encounter.  EXAM: RENAL / URINARY TRACT ULTRASOUND COMPLETE  COMPARISON:  None.  FINDINGS: Right Kidney:  Length: 12.3 cm. Echogenicity within normal limits. No mass or hydronephrosis visualized.  Left Kidney:  Length: 12.2 cm. Echogenicity within normal limits. No mass or hydronephrosis visualized.  Bladder:  Decompressed and poorly visualized.  IMPRESSION: Normal renal ultrasound. No evidence of hydronephrosis. Bladder poorly visualized.   Electronically Signed   By: Carey Bullocks M.D.   On: 06/04/2015 20:54   Dg Chest Port 1 View  06/23/2015   CLINICAL DATA:  Endotracheal tube placement  EXAM: PORTABLE CHEST - 1 VIEW  COMPARISON:  06/10/2015  FINDINGS: Endotracheal tube in satisfactory position. Right jugular catheter tip in the right innominate vein  unchanged. Left jugular catheter tip in the SVC unchanged. No pneumothorax  Interval increase in bibasilar atelectasis. Decreased lung volume compared with the prior study.  IMPRESSION: Endotracheal tube in satisfactory position  Decreased lung volume compared with the prior study. Increase in bibasilar atelectasis/ infiltrate compared to the prior study.   Electronically Signed   By: Marlan Palau M.D.   On: 06/06/2015 08:04  Dg Chest Port 1 View  06/10/2015   CLINICAL DATA:  Evaluate for evidence of aspiration; currently asymptomatic  EXAM: PORTABLE CHEST - 1 VIEW  COMPARISON:  Portable chest x-ray of June 09, 2015  FINDINGS: The lungs are reasonably well inflated and clear. The cardiac silhouette is mildly enlarged but stable. The pulmonary vascularity is normal. There is a large caliber right internal jugular venous catheter whose tip lies at the level of the junction of the right and left brachiocephalic veins. The left internal jugular venous catheter tip projects in the proximal SVC. There is no pleural effusion or pneumothorax. External pacing/defibrillator pads are present. The bony thorax is unremarkable.  IMPRESSION: There is no evidence of pneumonia. There is enlargement the cardiac silhouette without evidence of pulmonary edema.   Electronically Signed   By: David  SwazilandJordan M.D.   On: 06/10/2015 09:49   Dg Chest Port 1 View  06/09/2015   CLINICAL DATA:  Central line placement.  Initial encounter.  EXAM: PORTABLE CHEST - 1 VIEW  COMPARISON:  Chest radiograph performed earlier today at 6:51 p.m.  FINDINGS: A new left IJ line is noted ending about the proximal to mid SVC. A right IJ sheath is noted ending about the proximal SVC.  The lungs are well-aerated and clear. There is no evidence of focal opacification, pleural effusion or pneumothorax.  The cardiomediastinal silhouette is mildly enlarged. No acute osseous abnormalities are seen. External pacing pads are noted.  IMPRESSION: 1. New left IJ line  noted ending about the proximal to mid SVC. 2. Mild cardiomegaly; lungs remain grossly clear.   Electronically Signed   By: Roanna RaiderJeffery  Chang M.D.   On: 06/09/2015 22:52   Dg Chest Port 1 View  06/09/2015   CLINICAL DATA:  Patient has problems getting comfortable and could not sleep well last night. Orthopnea.  EXAM: PORTABLE CHEST - 1 VIEW  COMPARISON:  June 06, 2015  FINDINGS: The mediastinal contour is normal. The heart size is enlarged. There is no focal infiltrate, pulmonary edema, or pleural effusion. The visualized skeletal structures are unremarkable.  IMPRESSION: Cardiomegaly.  Lungs are clear.   Electronically Signed   By: Sherian ReinWei-Chen  Lin M.D.   On: 06/09/2015 20:53   Dg Chest Port 1 View  06/06/2015   CLINICAL DATA:  Respiratory failure. Line placement. Initial encounter.  EXAM: PORTABLE CHEST - 1 VIEW  COMPARISON:  06/05/2015 and 31-Jan-2015.  FINDINGS: 0921 hr. New right IJ central venous catheter, projecting along the lateral aspect of the superior mediastinum at the level of the upper SVC. No pneumothorax. The heart size and mediastinal contours are stable. There are low lung volumes with minimal bibasilar atelectasis.  IMPRESSION: New central venous catheter projects over the lateral aspect of the superior mediastinum at the upper SVC level. No evidence of pneumothorax.   Electronically Signed   By: Carey BullocksWilliam  Veazey M.D.   On: 06/06/2015 09:30   Dg Chest Port 1 View  06/05/2015   CLINICAL DATA:  CHF  EXAM: PORTABLE CHEST - 1 VIEW  COMPARISON:  31-Jan-2015  FINDINGS: Moderate enlargement of the cardiomediastinal silhouette reidentified. Lungs are hypoaerated but clear. No pleural effusion. Cardiac leads overlie the chest. No acute osseous finding.  IMPRESSION: No acute cardiopulmonary process. Cardiomegaly without focal acute finding.   Electronically Signed   By: Christiana PellantGretchen  Green M.D.   On: 06/05/2015 08:30   Dg Chest Port 1 View  05/17/2015   CLINICAL DATA:  Chest pain  EXAM: PORTABLE CHEST - 1 VIEW  COMPARISON:  None.  FINDINGS: Cardiac shadow is enlarged. The lungs are clear bilaterally. No acute bony abnormality is seen.  IMPRESSION: No acute abnormality noted.   Electronically Signed   By: Alcide Clever M.D.   On: 06/23/15 09:30   2D Echocardiogram  - There is diffuse hypokinesis with akinesis of the apical segments with an apical thrombus, overal LVEF is severely decreased estimated at 25-30%. A systemic anticoagulation is recommended.  PHYSICAL EXAM  Temp:  [93.6 F (34.2 C)-99 F (37.2 C)] 99 F (37.2 C) (07/07 0732) Pulse Rate:  [67-122] 79 (07/07 1000) Resp:  [16-45] 18 (07/07 1100) BP: (43-137)/(13-109) 49/28 mmHg (07/07 0828) SpO2:  [83 %-100 %] 100 % (07/07 1000) Arterial Line BP: (70-167)/(28-85) 70/28 mmHg (07/07 1100) FiO2 (%):  [80 %-100 %] 100 % (07/07 0828)  General - Well nourished, well developed, intubated and unresponsive.  Ophthalmologic - deferred due to comfort care.  Cardiovascular - Regular rate and rhythm with no murmur.  Neck - supple, no carotid bruits  Neuro - intubated and on 3 pressors, not responsive to pain stimulation or voice, pupil 3mm bilaterally, very sluggish to light, left slight brisk than right, no doll's eyes, no corneal, and no gag or cough, not breathing over the ventilation. No movement of all extremities on pain stimulation, babinski mute bilaterally.   ASSESSMENT/PLAN Joe Love is a 54 y.o. male with history of recent MI with LV thrombus on anticoagulation but developed GIB requiring blood transfusion as well as on dural antiplatelet was consulted by neurology for acute onset left sided weakness and decreased responsiveness. Not tPA candidate due to recent MI and GIB. Overnight condition worsened and now with diminished brainstem reflex making him close to brain death. BP low even on maximum pressors. Prognosis is extremely poor. Discussed with family and plan for comfort care.     Stroke:  Right hemisphere stroke,  likely embolic due to LV thrombus secondary to MI and not on anticoagulation.   MRI  Not able to perform  MRA  Not able to perform  Carotid Doppler  Not able to perform due to worsening clinical condition  2D Echo  LV thrombus with EF 25-30%  LDL 57  HgbA1c 9.9  SCDs for VTE prophylaxis  Diet NPO time specified   no antithrombotic prior to admission, now on aspirin 81 mg orally every day and brilinta  Neurologically worsening and now with diminished brainstem reflex, close to brain death  Extremely poor prognosis  Discussed with family members and plan for comfort care  Diabetes  HgbA1c 9.9 goal < 7.0  Uncontrolled  Cardiogenic shock  SBP 40s by cuff and 80s by A-line  On 3 pressors maximized doses  Cardiology on board  Hyperlipidemia  Home meds:  lipitor 80   Currently on lipitor 80  LDL 57, goal < 70  Other Stroke Risk Factors  Obesity, Body mass index is 34.08 kg/(m^2).   Coronary artery disease  Other Active Problems  Leukocytosis WBC 55.8  Shock liver - AST 1926 and ALT 1182  Hospital day # 9  This patient is critically ill due to MI, LV thrombus, cardiogenic shock and embolic strokes and at significant risk of neurological worsening, death form hypotension, heart failure, recurrent stroke, systemic embolism and brain death. This patient's care requires constant monitoring of vital signs, hemodynamics, respiratory and cardiac monitoring, review of multiple databases, neurological assessment, discussion with family, other specialists and medical decision making of high complexity. I spent 40 minutes of  neurocritical care time in the care of this patient.  Due to the extremely poor prognosis and family wishes to pursue comfort care, we will sign off for now. Please call with further questions.  Marvel Plan, MD PhD Stroke Neurology 2015/07/02 11:26 AM    To contact Stroke Continuity provider, please refer to [email or web address]. After hours, contact  General Neurology

## 2015-07-07 NOTE — Progress Notes (Signed)
Called by Marchelle FolksAmanda, RN re: change in LOC of Mr Joe Love. She reports within the past 20 minutes the patient has become poorly responsive and has stopped moving left side. I advised to call a Code Stroke and came to bedside to evaluate the patient.   Chart reviewed at length. He is extremely complex. Presented 6/30 with a late presenting inferolateral STEMI and complicated by acute renal failure. 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 He has severe LV dysfunction with LVEF < 30%, LV apical thrombus. He has had multiple problems over the course of his hospitalization with cardiogenic shock, currently on norepinephrine. He has had a GI bleed and has required multiple transfusions of PRBC's. He is currently on CVVH. There are plans for eventual anticoagulation after bleeding issues stabilize. He has also had PAF and now is maintaining sinus rhythm on amiodarone. His WBC is 55K. He had a pulseless VT arrest on 7/4.   Tonight, the patient became tachypneic and less responsive. He did not move his left side. On my evaluation, he awakens and responds to verbal stimuli. He follows commands. Speech is difficult to understand but not completely incomprehensible. He moves his right side to command but there is no movement of the left arm or leg. Heart is RRR without murmur, lungs are clear, there is no edema. Abdomen is soft and NT. Vitals reviewed and BP is 89/50, HR 80 bpm, afebrile, R 32. Tele shows sinus rhythm.  Code Stroke called. Stroke RN at bedside. Dr Onalee HuaMcNeil Kirkpatrick now at bedside as well. We have reviewed his case in detail. Notes of GI, Cardiology, CCM, and renal reviewed. His neurologic status is slowly improving. He is not a candidate for TPA with recent large MI and active GI bleed. Plans for CT brain. CTA will not be done as to avoid IV contrast and further renal deterioration. Current therapies  to be continued with IV levophed, amiodarone. Will continue antiplatelet Rx for now with ASA and brilinta.   Will continue supportive care and monitor patient's progress. His BP appears stable on levophed and heart rhythm is stable on amiodarone. It appeared he may require intubation and CCM was here to evaluate him, but his neurologic status is improving and it appears he may be able to protect his airway now.  The patient is critically ill with multiple organ systems failure and requires high complexity decision making for assessment and support, frequent evaluation and titration of therapies, application of advanced monitoring technologies and extensive interpretation of multiple databases.   Critical Care Time devoted to patient care services described in this note is 45 minutes.  Tonny BollmanCooper, Elhadj Girton 06/14/2015 4:04 AM

## 2015-07-07 NOTE — Progress Notes (Signed)
Patient had subsequent worsening of respiratory status requiring intubation. Following this, he has become unstable. Requiring increasing doses of pressors to maintain pressure. I discussed with CCM and cardiology, I do not think that he is stable enough to transport to [facility] for consideration of intervention at this time and he is not a tpa candidate. Given the waxing and waning nature of his deficits before, he may have a partially occluded vessel that partially recanalized and now has thrombosed. It is not clear based on his exam that this is anterior circulation, but a posterior MCA stroke is possible.   Stroke team will continue to follow.   Ritta SlotMcNeill Justan Gaede, MD [facility] [phone]  If 7pm- 7am, please page neurology on call as listed in AMION.

## 2015-07-07 NOTE — Progress Notes (Signed)
Patient Name: Joe Love Date of Encounter: 06-19-15  Principal Problem:   ST elevation myocardial infarction (STEMI) involving right coronary artery with complication Active Problems:   Acute renal failure syndrome   Acute systolic CHF (congestive heart failure)   Anemia of renal disease   LV (left ventricular) mural thrombus following MI   Metabolic acidosis   Diabetes mellitus type 2 with complications   Hyperlipidemia   Hyposmolality and/or hyponatremia   Cardiogenic shock   Respiratory failure   PAF (paroxysmal atrial fibrillation)   Ventricular tachycardia   CAD (coronary artery disease)   Hyperkalemia   Upper GI bleed   Gastritis   Duodenal ulcer disease   Cardiomyopathy, ischemic   Leukocytosis   Duodenal ulcer hemorrhage   Acute blood loss anemia   Acute respiratory failure with hypoxia   Primary Cardiologist: Dr Tresa Endo  Patient Profile: 54 yo male w/ no prev CAD, admitted 06/28 w/ STEMI and apical thrombus, on sig pressors. 07/07 early am, ?code stroke but not TPA candidate. Intubated for airway protection. Hypotension worse overnight, CVVH stopped.   SUBJECTIVE: Had etomidate, fentanyl and versed at 6:15 am for the intubation. Currently unresponsive on the vent.  OBJECTIVE Filed Vitals:   06/19/2015 1610 06/19/2015 0701 06-19-15 0703 06-19-15 0732  BP:      Pulse:      Temp:    99 F (37.2 C)  TempSrc:    Oral  Resp: Height:      Weight:      SpO2:        Intake/Output Summary (Last 24 hours) at June 19, 2015 0735 Last data filed at Jun 19, 2015 0400  Gross per 24 hour  Intake   3102 ml  Output   1510 ml  Net   1592 ml   Filed Weights   06/09/15 0401 06/10/15 0500 06/11/15 0500  Weight: 252 lb 13.9 oz (114.7 kg) 252 lb 3.3 oz (114.4 kg) 251 lb 5.2 oz (114 kg)    PHYSICAL EXAM General: Well developed, well nourished, male in no acute distress. Head: Normocephalic, atraumatic.  Neck: Supple without bruits, JVD. Lungs:  Resp regular and  unlabored, CTA. Heart: RRR, S1, S2, no S3, S4, or murmur; no rub. Abdomen: Soft, non-tender, non-distended, BS + x 4.  Extremities: No clubbing, cyanosis, edema.  Neuro: Alert and oriented X 3. Moves all extremities spontaneously. Psych: Normal affect.  LABS: CBC: Recent Labs  06/11/15 1631 2015-06-19 0350  WBC 55.8* PENDING  HGB 9.6* 9.9*  HCT 28.2* 29.7*  MCV 76.2* 77.5*  PLT 342 PENDING   INR: Recent Labs  2015/06/19 0350  INR 3.44*   Basic Metabolic Panel: Recent Labs  06/11/15 0530 06/11/15 1630 June 19, 2015 0350  NA 132* 133* 133*  K 6.2* 5.2* 4.9  CL 95* 97* 99*  CO2 15* 16* 14*  GLUCOSE 181* 228* 163*  BUN 143* 118* 92*  CREATININE 5.89* 4.83* 4.25*  CALCIUM 8.0* 7.6* 6.5*  MG 2.9*  --  2.6*  PHOS 11.1* 9.1* 8.9*   Liver Function Tests: Recent Labs  06/11/15 1630 06/19/2015 0350  AST  --  1926*  ALT  --  1182*  ALKPHOS  --  217*  BILITOT  --  1.5*  PROT  --  4.5*  ALBUMIN 2.4* 2.1*  2.1*   Cardiac Enzymes: Recent Labs  06/10/15 1015  TROPONINI 18.48*   BNP:  B NATRIURETIC PEPTIDE  Date/Time Value Ref Dareld Mcauliffetus  06/09/2015 06:22 PM 648.7* 0.0 -  100.0 pg/mL Final  06/04/2015 11:36 AM 679.9* 0.0 - 100.0 pg/mL Final    TELE: SR, ST       Radiology/Studies: Ct Head Wo Contrast  06/18/2015   CLINICAL DATA:  30 minutes of left-sided weakness.  EXAM: CT HEAD WITHOUT CONTRAST  TECHNIQUE: Contiguous axial images were obtained from the base of the skull through the vertex without intravenous contrast.  COMPARISON:  06/05/2015  FINDINGS: Skull and Sinuses:Negative for fracture or destructive process. The mastoids, middle ears, and imaged paranasal sinuses are clear.  Orbits: No acute abnormality.  Brain: Low-density in the peripheral left cerebellum is stable from previous and remains consistent with a small vessel infarct. No indication of new infarction. ASPECTS is 10. No intracranial hemorrhage, hydrocephalus, or mass lesion.  IMPRESSION: 1. No acute  finding. 2. 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 3. Motion degraded study.   Electronically Signed   By: Marnee SpringJonathon  Watts M.D.   On: 06/10/2015 04:38   Dg Chest Port 1 View  06/10/2015   CLINICAL DATA:  Evaluate for evidence of aspiration; currently asymptomatic  EXAM: PORTABLE CHEST - 1 VIEW  COMPARISON:  Portable chest x-ray of June 09, 2015  FINDINGS: The lungs are reasonably well inflated and clear. The cardiac silhouette is mildly enlarged but stable. The pulmonary vascularity is normal. There is a large caliber right internal jugular venous catheter whose tip lies at the level of the junction of the right and left brachiocephalic veins. The left internal jugular venous catheter tip projects in the proximal SVC. There is no pleural effusion or pneumothorax. External pacing/defibrillator pads are present. The bony thorax is unremarkable.  IMPRESSION: There is no evidence of pneumonia. There is enlargement the cardiac silhouette without evidence of pulmonary edema.   Electronically Signed   By: David  Swazil[place] M.D.   On: 06/10/2015 09:49     Current Medications:  .  stroke: mapping our early stages of recovery book   Does not apply Once  . sodium chloride   Intravenous Once  . aspirin EC  81 mg Oral Daily  . atorvastatin  80 mg Oral q1800  . insulin aspart  0-15 Units Subcutaneous TID WC  . lidocaine (cardiac) 100 mg/905ml      . pantoprazole (PROTONIX) IV  40 mg Intravenous Q12H  . piperacillin-tazobactam  3.375 g Intravenous 4 times per day  . rocuronium      . succinylcholine      . ticagrelor  90 mg Oral BID  . vancomycin  1,000 mg Intravenous Q24H   . amiodarone 30 mg/hr (06/11/15 1900)  . epinephrine 30 mcg/min (06/14/2015 0704)  . norepinephrine (LEVOPHED) Adult infusion 42 mcg/min (06/14/2015 0537)  . phenylephrine (NEO-SYNEPHRINE) Adult infusion 400 mcg/min (06/11/2015 0634)  . dialysis replacement fluid (prismasate) 400 mL/hr at 07/06/2015 0031  . dialysis replacement fluid  (prismasate) 200 mL/hr at 06/11/15 0908  . dialysate (PRISMASATE) 2,000 mL/hr at 06/11/2015 0108    ASSESSMENT AND PLAN: Pt has taken a significant turn for the worse overnight. Neurology saw for possible code stroke, with L weakness. Pt then had worsening of his respiratory status, req intubation.   He is on very high doses of multiple pressors and required additional boluses of epinephrine to maintain pressure, even after taken off CRRT. LFTs are very high, so will d/c amiodarone.   2 sisters and a granddaughter present in room with 2 pastors. Discussed the deterioration of his condition.   MD advise further.   Principal Problem:  ST elevation myocardial infarction (STEMI) involving right coronary artery with complication Active Problems:   Acute renal failure syndrome   Acute systolic CHF (congestive heart failure)   Anemia of renal disease   LV (left ventricular) mural thrombus following MI   Metabolic acidosis   Diabetes mellitus type 2 with complications   Hyperlipidemia   Hyposmolality and/or hyponatremia   Cardiogenic shock   Respiratory failure   PAF (paroxysmal atrial fibrillation)   Ventricular tachycardia   CAD (coronary artery disease)   Hyperkalemia   Upper GI bleed   Gastritis   Duodenal ulcer disease   Cardiomyopathy, ischemic   Leukocytosis   Duodenal ulcer hemorrhage   Acute blood loss anemia   Acute respiratory failure with hypoxia   Signed, Theodore Demark , PA-C 7:35 AM 07/05/2015

## 2015-07-07 NOTE — Progress Notes (Signed)
SLP Cancellation Note  Patient Details Name: Joe Love MRN: [id number] DOB: Jun 30, 1961   Cancelled treatment:        Pt now intubated D/C ST.   [name], Joe Love 06/23/2015, 8:14 AM   [name] [name] M.Ed [employer]-SLP Pager [phone]

## 2015-07-07 NOTE — Code Documentation (Signed)
Called by charge RN to evaluate pt for possible stroke.  Pt was normal at 0245 and then discovered at 0255 to have ALOC and flaccid on Lt.On assessment pt with tachypnea, Lt slide flaccid, severe sensory impairment, slight facial droop, slight dysarthria, able to f/c.  Contacted Dr. Amada JupiterKirkpatrick to evaluate pt.  By his arrival pt had improved to moving his Lt side, sensory normal, & RR decreased.  Pt taken for CT head scan.  NIH 6.

## 2015-07-07 NOTE — Progress Notes (Signed)
Chaplain was referred by family. Chaplain prayed with family and supported family. Chaplain recommended a follow up during the day.   07/04/2015 0800  Clinical Encounter Type  Visited With Patient and family together  Visit Type Spiritual support  Consult/Referral To Chaplain  Spiritual Encounters  Spiritual Needs Prayer;Emotional

## 2015-07-07 NOTE — Progress Notes (Signed)
Pt with further hemodynamic decline post-intubation. Unable to transfer out for acute stroke intervention because of hemodynamic instability. Pt administered additional epinephrine boluses and neosinephrine added. His outlook is grim. Family updated. CCM at bedside managing his care. Neuro has been at bedside all morning as well.  Joe Love, Joe Love 06/22/2015 7:15 AM

## 2015-07-07 NOTE — Progress Notes (Signed)
PULMONARY / CRITICAL CARE MEDICINE   Name: Joe Love MRN: [id number] DOB: Nov 16, 1961    ADMISSION DATE:  06/15/15 CONSULTATION DATE:  06/09/2015  REFERRING MD :  Dr. Tresa Endo  CHIEF COMPLAINT:  VT arrest  INITIAL PRESENTATION:  54 year old male with PMH as below, which includes HTN, HLD, and uncomplicated DM admitted for STEMI 6/28. Symptoms initially with onset 3 days PTA, which were described as indigestion while attending a wedding. He also had intermittent chest pain associated with nausea. This mostly resolved, but nausea and overall malaise still lingered so he presented to [facility] ED 6/28. In ED he was noted to have inferior ST segment elevations on EKG. He was taken emergently to cath lab, where he was found to have total occlusion of the mid distal LAD, total occlusion of the circumflex involving the OM 2 and distal circumflex and total occlusion of the RCA just beyond the ostium. RCA flow was successfully restored with a long arduous procedure including PTCA, thrombectomy, DES stenting of the ostium to the mid segment and PTCA of the distal RCA. Echo 6/29 found LV apical thrombus and severely reduced LVEF. He required epinephrine infusion. 7/1 he developed GI bleed and anticoagulation for LV thrombus was dc'd. Also developed AF RVR which was responsive to chemical cardioversion with amio. He had been improving since that time with CRRT, 7/3 he was able to come off epi gtt and CRRT. 7/4 he developed some intermittent runs of NSVT, which evenutally caused him to suffer a very brief VT arrest. Post arrest he was awake and in no distress. PCCM consulted to assist with medical management.    STUDIES:  6/29 Echo > LVEF 25-30%, diffuse hypokinesis with akinesis of apical segments, with apical thrombus.   SIGNIFICANT EVENTS: 6/28 > admitted for STEMI, to cath lab, PCI > cardiogenic shock 7/1 > started HD for AKI s/p cath 7/3 > off pressors, CVVHD stopped 7/4 > very brief VT arrest  06/10/15:  transiently on lidocaine infusion but now ion amio gtt and levophed gtt. No more torsades. REsponded to lasix 80mg  and off o2. Face mask Never needed bipap. Still having black stools but none since morning 7am. Anticoagulation to be restarted by cards today. S/p prbc yesteday to get hgb > 8gm%   SUBJECTIVE/OVERNIGHT/INTERVAL HX 07/04/2015 : called to bedsicde by eMD, neuro and cards: patient has developed acute stroke with dense left hemiplegia with poor airway protection. He had been on near max dose of pressors already. Was on CVVH; stopped at 3am. Easy iintubaion but since then having worsening circulatory shock neednig epi boluses x 3   Did have rising WBC t 55K yesteray - suspected leukeumoid reaction   VITAL SIGNS: Temp:  [93.6 F (34.2 C)-97.4 F (36.3 C)] 97 F (36.1 C) (07/07 0418) Pulse Rate:  [67-83] 81 (07/07 0530) Resp:  [16-45] 40 (07/07 0530) BP: (63-113)/(22-86) 87/73 mmHg (07/07 0530) SpO2:  [83 %-100 %] 100 % (07/07 0530) HEMODYNAMICS: CVP:  [2 mmHg-3 mmHg] 2 mmHg VENTILATOR SETTINGS:   INTAKE / OUTPUT:  Intake/Output Summary (Last 24 hours) at 06/24/2015 0615 Last data filed at 07/06/2015 0400  Gross per 24 hour  Intake 3139.3 ml  Output   1510 ml  Net 1629.3 ml    PHYSICAL EXAMINATION: General:  Obese male , looked moribund pre- intubation Neuro:  Alert, oriented, but drowsy and significant left hemiplegia HEENT:  [place]/AT, no JVD noted, PERRL Cardiovascular:  RRR, no MRG Lungs:  Diminished bases Abdomen:  Soft, non-tender, non-distended  Musculoskeletal:  No acute deformity, no edema Skin:  Grossly intact  LABS: PULMONARY  Recent Labs Lab 06/09/15 1900 06/10/15 0038  PHART  --  7.425  PCO2ART  --  32.2*  PO2ART  --  408*  HCO3  --  20.9  TCO2  --  21.9  O2SAT 46.0 99.8    CBC  Recent Labs Lab 06/11/15 0530 06/11/15 1631 06/09/2015 0350  HGB 6.6* 9.6* 9.9*  HCT 20.4* 28.2* 29.7*  WBC 48.4* 55.8* PENDING  PLT 358 342 PENDING     COAGULATION  Recent Labs Lab 06/08/15 0410 06/09/15 0400 06/10/15 0539 06/11/15 0530 06/11/2015 0350  INR 1.52* 1.43 1.65* 2.96* 3.44*    CARDIAC    Recent Labs Lab 06/10/15 1015  TROPONINI 18.48*   No results for input(s): PROBNP in the last 168 hours.   CHEMISTRY  Recent Labs Lab 06/09/15 0400  06/09/15 2004 06/10/15 0539 06/11/15 0530 06/11/15 1630 07/06/2015 0350  NA 133*  133*  < > 132* 131*  131* 132* 133* 133*  K 3.7  3.8  < > 4.8 5.1  5.1 6.2* 5.2* 4.9  CL 98*  97*  < > 99* 98*  98* 95* 97* 99*  CO2 25  26  < > 19* 22  22 15* 16* 14*  GLUCOSE 207*  209*  < > 275* 303*  304* 181* 228* 163*  BUN 58*  57*  < > 91* 107*  107* 143* 118* 92*  CREATININE 3.14*  3.12*  < > 3.65* 3.89*  3.93* 5.89* 4.83* 4.25*  CALCIUM 7.2*  7.2*  < > 7.4* 7.4*  7.4* 8.0* 7.6* 6.5*  MG 2.2  --  2.3 2.6* 2.9*  --  2.6*  PHOS 3.7  < > 5.2* 5.5* 11.1* 9.1* 8.9*  < > = values in this interval not displayed. Estimated Creatinine Clearance: 25.5 mL/min (by C-G formula based on Cr of 4.25).   LIVER  Recent Labs Lab 06/08/15 0410  06/09/15 0400  06/09/15 2004 06/10/15 0539 06/11/15 0530 06/11/15 1630 06/21/2015 0350  ALBUMIN 2.4*  < > 2.3*  < > 2.1* 2.2* 2.2* 2.4* 2.1*  INR 1.52*  --  1.43  --   --  1.65* 2.96*  --  3.44*  < > = values in this interval not displayed.   INFECTIOUS  Recent Labs Lab 06/09/15 2013 06/10/15 0050 06/10/15 1015 06/11/15 0530 06/09/2015 0350  LATICACIDVEN 4.9* 1.6  --   --   --   PROCALCITON  --   --  6.10 9.58 12.03     ENDOCRINE CBG (last 3)   Recent Labs  06/11/15 1609 06/11/15 2234 06/18/2015 0312  GLUCAP 218* 195* 160*         IMAGING x48h  - image(s) personally visualized  -   highlighted in bold Ct Head Wo Contrast  06/08/2015   CLINICAL DATA:  30 minutes of left-sided weakness.  EXAM: CT HEAD WITHOUT CONTRAST  TECHNIQUE: Contiguous axial images were obtained from the base of the skull through the vertex  without intravenous contrast.  COMPARISON:  06/05/2015  FINDINGS: Skull and Sinuses:Negative for fracture or destructive process. The mastoids, middle ears, and imaged paranasal sinuses are clear.  Orbits: No acute abnormality.  Brain: Low-density in the peripheral left cerebellum is stable from previous and remains consistent with a small vessel infarct. No indication of new infarction. ASPECTS is 10. No intracranial hemorrhage, hydrocephalus, or mass lesion.  IMPRESSION: 1. No acute finding. 2. Remote small vessel  infarcts in the left cerebellum. 3. Motion degraded study.   Electronically Signed   By: Marnee SpringJonathon  Watts M.D.   On: 2015/08/08 04:38   Dg Chest Port 1 View  06/10/2015   CLINICAL DATA:  Evaluate for evidence of aspiration; currently asymptomatic  EXAM: PORTABLE CHEST - 1 VIEW  COMPARISON:  Portable chest x-ray of June 09, 2015  FINDINGS: The lungs are reasonably well inflated and clear. The cardiac silhouette is mildly enlarged but stable. The pulmonary vascularity is normal. There is a large caliber right internal jugular venous catheter whose tip lies at the level of the junction of the right and left brachiocephalic veins. The left internal jugular venous catheter tip projects in the proximal SVC. There is no pleural effusion or pneumothorax. External pacing/defibrillator pads are present. The bony thorax is unremarkable.  IMPRESSION: There is no evidence of pneumonia. There is enlargement the cardiac silhouette without evidence of pulmonary edema.   Electronically Signed   By: David  [place]azil[place]Jordan M.D.   On: 06/10/2015 09:49         ASSESSMENT / PLAN:  PULMONARY ETT 07/06/2015 >> A: Acute resp failure 07/06/2015 du eto acute stroke - s/p intubation 06/15/2015  P:   Full vent support   CARDIOVASCULAR HD cath RIJ 7/1 >>> LIJ CVL 7/4 >>> A:  Severe 3 vessel CAD with S/p PCI for STEMI VT with brief arrest -  06/09/15 SEvere LV dyssfynction - acute systolic CHF with EF < 30% with Apical  thrombus    - Ongoing Circulatory Shock 06/18/2015 worse post intubation despite max dose pressors needing epi boluses. Cards thinks life expectancy is few hours  P:  Stat a-line Levophed for map goal > 60 Additional pressor decision by cards Not an impella candidate due to apical thrombus Per cards  RENAL A:   ATN - stage 3 in setting STEMI with PCI and cardiogenic shock (off CRRT 7/3). Restarted 76/16   - CRRT on hold following acute stroke  P:   Renal following   GASTROINTESTINAL A:   UGI bleed in setting of cardiac anticoagulation. Sp EGD 06/08/2015 with Gastrictis an duodenal ulcer - s/p clipping of visible vesel. POSITIVE H PYLORRI ANTIBODY   - no active bleeding 06/11/2015 other than dark tarry stools.   P:   Anticoagulation on hold Protonix IV If surivves - start amox and clarithromycin for 2 weeks GI follwoing  HEMATOLOGIC  Recent Labs Lab 06/11/15 0530 06/11/15 1631 Jul 30, 2015 0350  HGB 6.6* 9.6* 9.9*  HCT 20.4* 28.2* 29.7*  WBC 48.4* 55.8* PENDING  PLT 358 342 PENDING    A:   Anemia, suspect acute blood loss 2nd to GI bleeding /sp PRBC 06/11/15 LV apical thrombus  - hgb 9.9.gm% 06/11/2015   P:  Monitor CBC Goal hgb > / = 8gm% Transfuse per ICU guidelines - goal > 8gm% On dual antiplatelet therapy   INFECTIOUS  Recent Labs Lab 06/10/15 1015 06/11/15 0530 Jul 30, 2015 0350  PROCALCITON 6.10 9.58 12.03    A:   No fever but WBC rising and PCT hight - both could be false positive  P:   BCx2 7/4 >>> UC 7/4 >>> PCT algorithm 06/10/2015 Dc vanc in setting of ATN COntinue zosyn  ENDOCRINE A:   DM  P:   CBG monitoring and SSI  NEUROLOGIC A:   Acute STroke - in need of intervention per neuro but too unstable for transfer  P:   fent prn + gtt Versed prn    FAMILY  -  Updates: family updated about need for intubation 06/23/2015   - Inter-disciplinary family meet or Palliative Care meeting  - d/w Dr Excell Seltzer primary service - patient life expectancy  is a few hours. He is talking to familyu       The patient is critically ill with multiple organ systems failure and requires high complexity decision making for assessment and support, frequent evaluation and titration of therapies, application of advanced monitoring technologies and extensive interpretation of multiple databases.   Critical Care Time devoted to patient care services described in this note is  45  Minutes. This time reflects time of care of this signee Dr Kalman Shan. This critical care time does not reflect procedure time, or teaching time or supervisory time of PA/NP/Med student/Med Resident etc but could involve care discussion time    Dr. Kalman Shan, M.D., [facility].C.P Pulmonary and Critical Care Medicine Staff Physician [facility] [place] [facility] Pager: [phone], If no answer or between  15:00h - 7:00h: call [phone]  07/06/2015 6:36 AM         Dr. Kalman Shan, M.D., [facility].C.P Pulmonary and Critical Care Medicine Staff Physician [facility] [place] [facility] [place] [facility] and Critical Care Pager: [phone], If no answer or between  15:00h - 7:00h: call [phone]  06/18/2015 6:15 AM

## 2015-07-07 DEATH — deceased

## 2015-07-28 ENCOUNTER — Telehealth: Payer: Self-pay

## 2015-07-28 NOTE — Telephone Encounter (Signed)
Patient's wife called to let us know that the life insurance company will be contacting us to get the information needed. She will no longer be sending a fax

## 2015-07-28 NOTE — Telephone Encounter (Signed)
Patient wife is calling because the patient passed and they need information filled out for the life insurance company. Joe Love will fax the forms today and wants them faxed back. Fax: [phone]  Please call once completed!

## 2015-07-31 NOTE — Telephone Encounter (Signed)
FYI

## 2015-10-09 IMAGING — CT CT HEAD W/O CM
1 of 2 series · 14 of 30 positions shown, 18 images · non-contrast
Comparison: None.

CLINICAL DATA: Head injury after fall.  On Coumadin.

EXAM:
CT HEAD WITHOUT CONTRAST
TECHNIQUE: Contiguous axial images were obtained from the base of the skull
through the vertex without intravenous contrast.

[Series 2: head 5.0 h30s · axial · 0.45mm/px · z∈[-128,+12]mm · 14 of 34 slices shown, 18 images]
[im 3/34  brain]
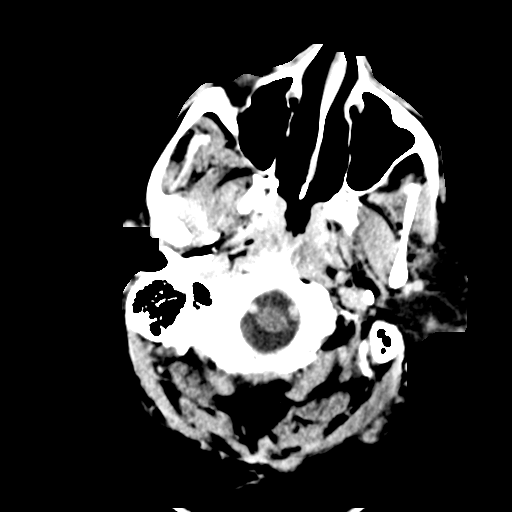
[im 3/34  bone]
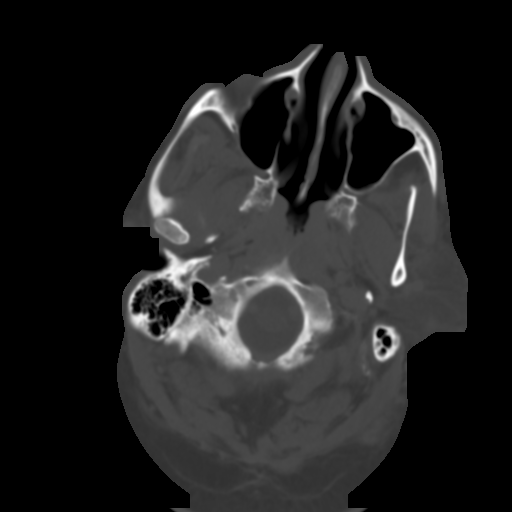
[im 5/34  brain]
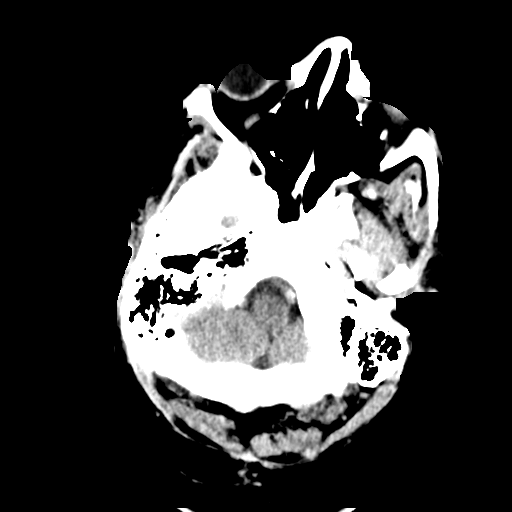
[im 7/34  brain]
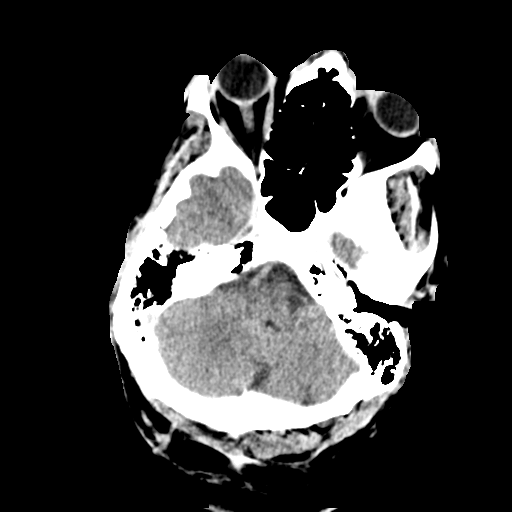
[im 9/34  brain]
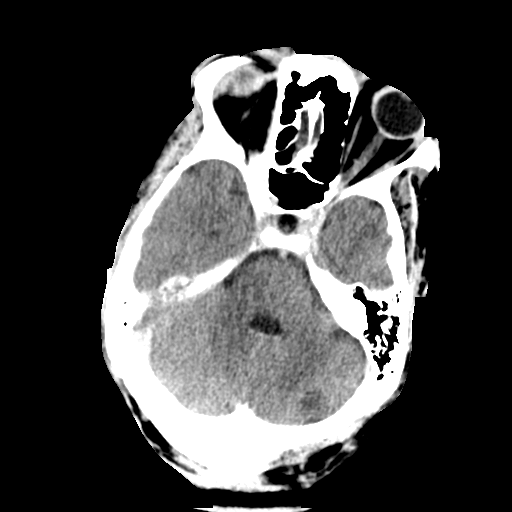
[im 12/34  brain]
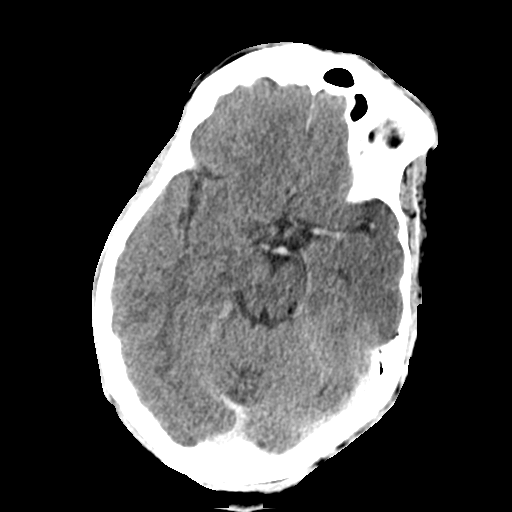
[im 12/34  bone]
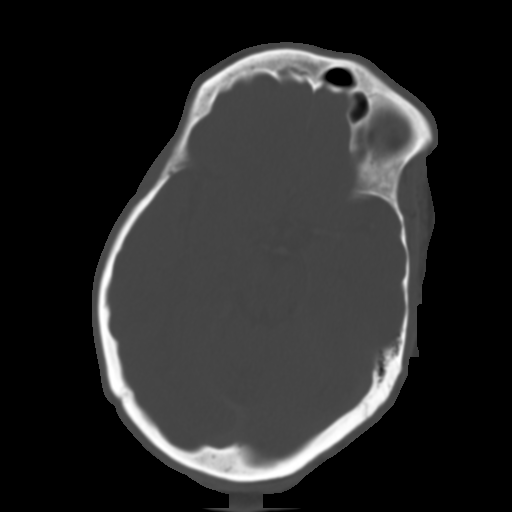
[im 14/34  brain]
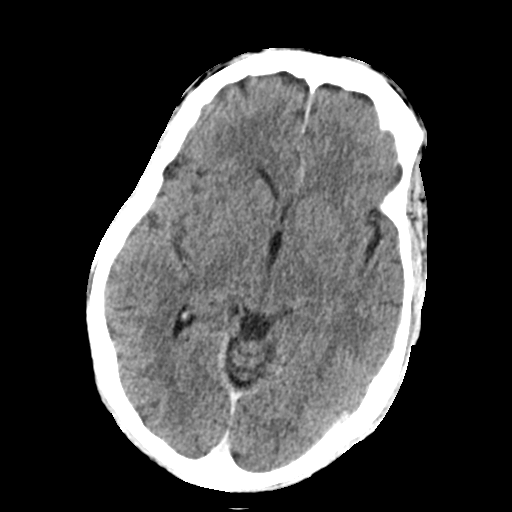
[im 16/34  brain]
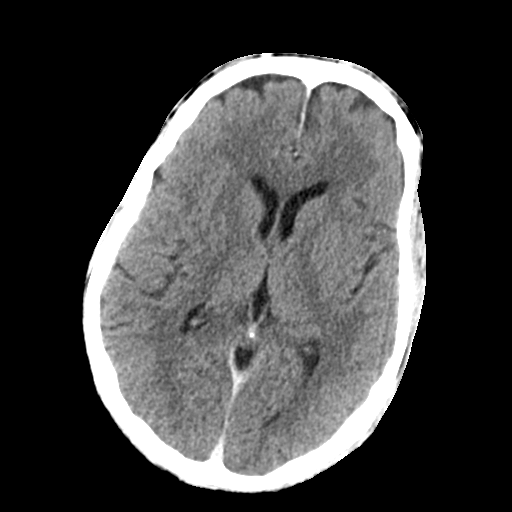
[im 18/34  brain]
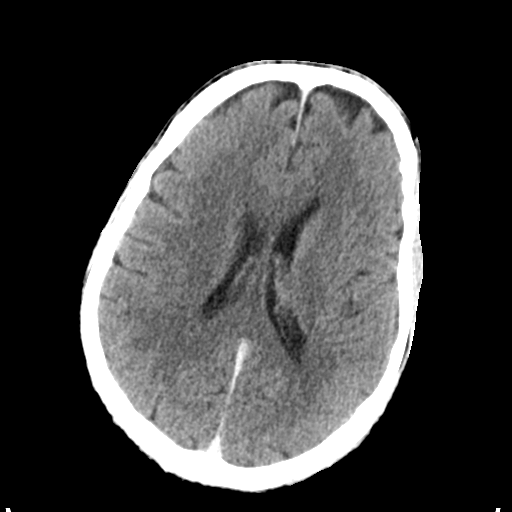
[im 20/34  brain]
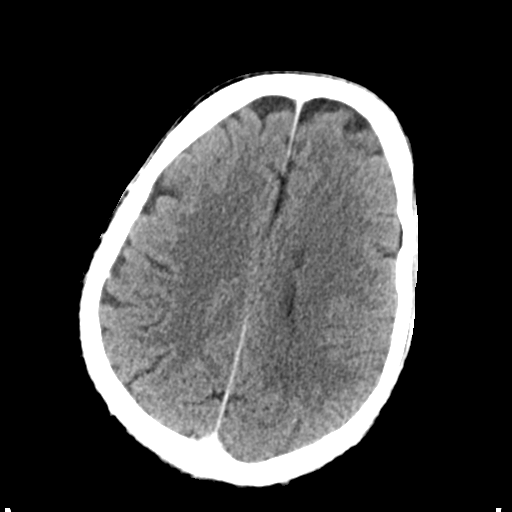
[im 20/34  bone]
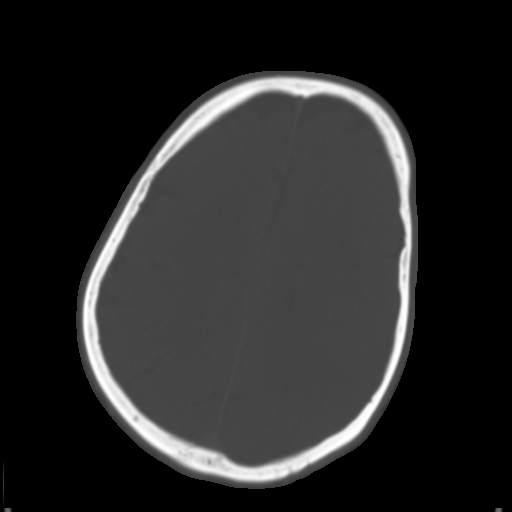
[im 23/34  brain]
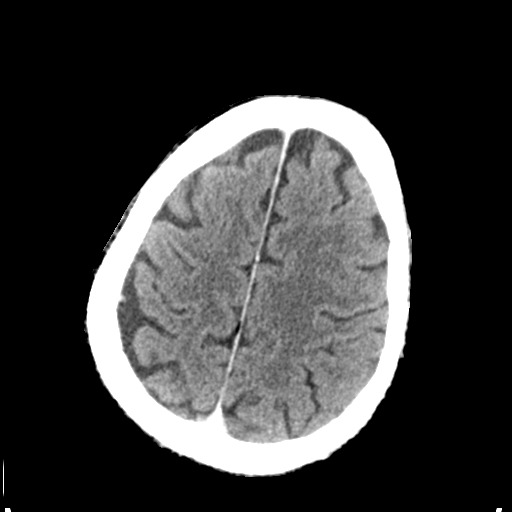
[im 25/34  brain]
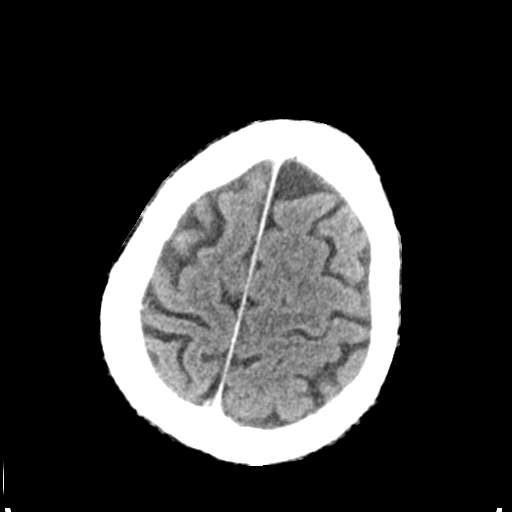
[im 27/34  brain]
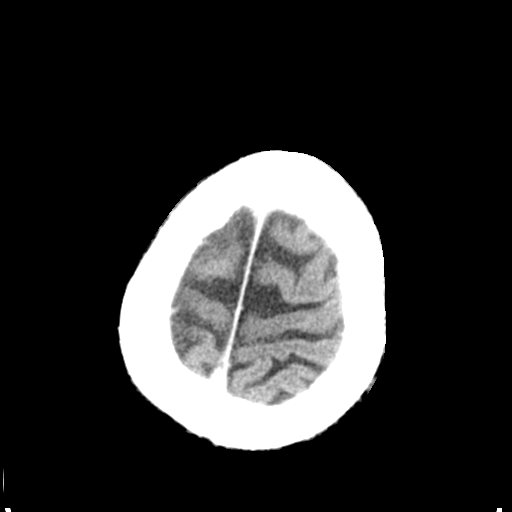
[im 29/34  brain]
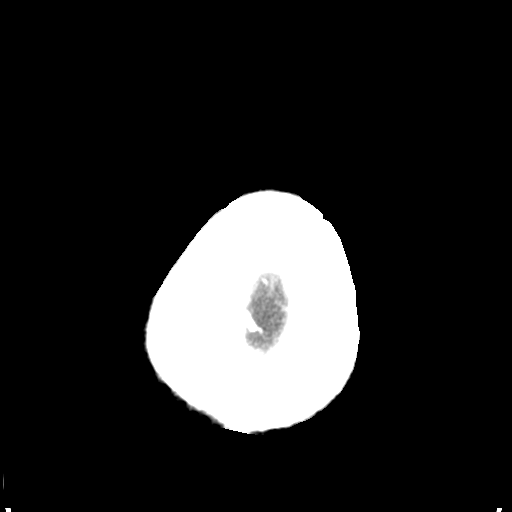
[im 29/34  bone]
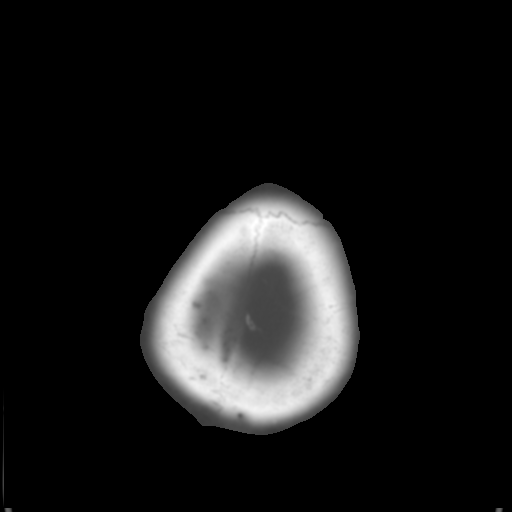
[im 31/34  brain]
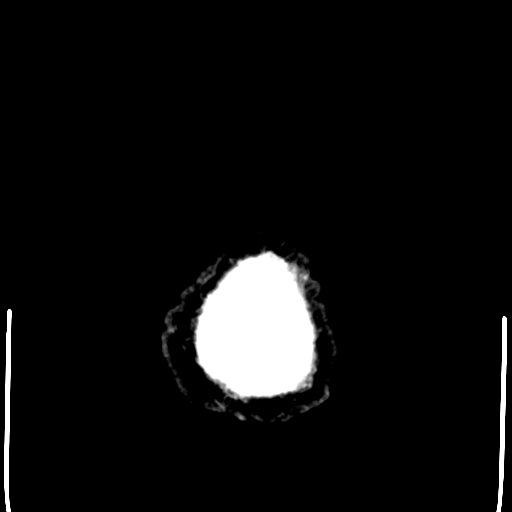

[14 of 30 positions shown; findings below may reference images not displayed]

FINDINGS: Bony calvarium appears intact. No mass effect or midline shift is
noted. Ventricular size is within normal limits. There is no
evidence of hemorrhage. Focal low density is seen peripherally in
the left cerebellar hemisphere most consistent with infarction of
indeterminate age.
IMPRESSION: Focal low density seen peripherally in left cerebellar hemisphere
concerning for infarction of indeterminate age. MRI is recommended
for further evaluation.

## 2015-10-14 IMAGING — CR DG CHEST 1V PORT
1 series · 1 of 1 positions shown · non-contrast
Comparison: Portable chest x-ray June 09, 2015

CLINICAL DATA: Evaluate for evidence of aspiration; currently
asymptomatic

EXAM:
PORTABLE CHEST - 1 VIEW

[AP]
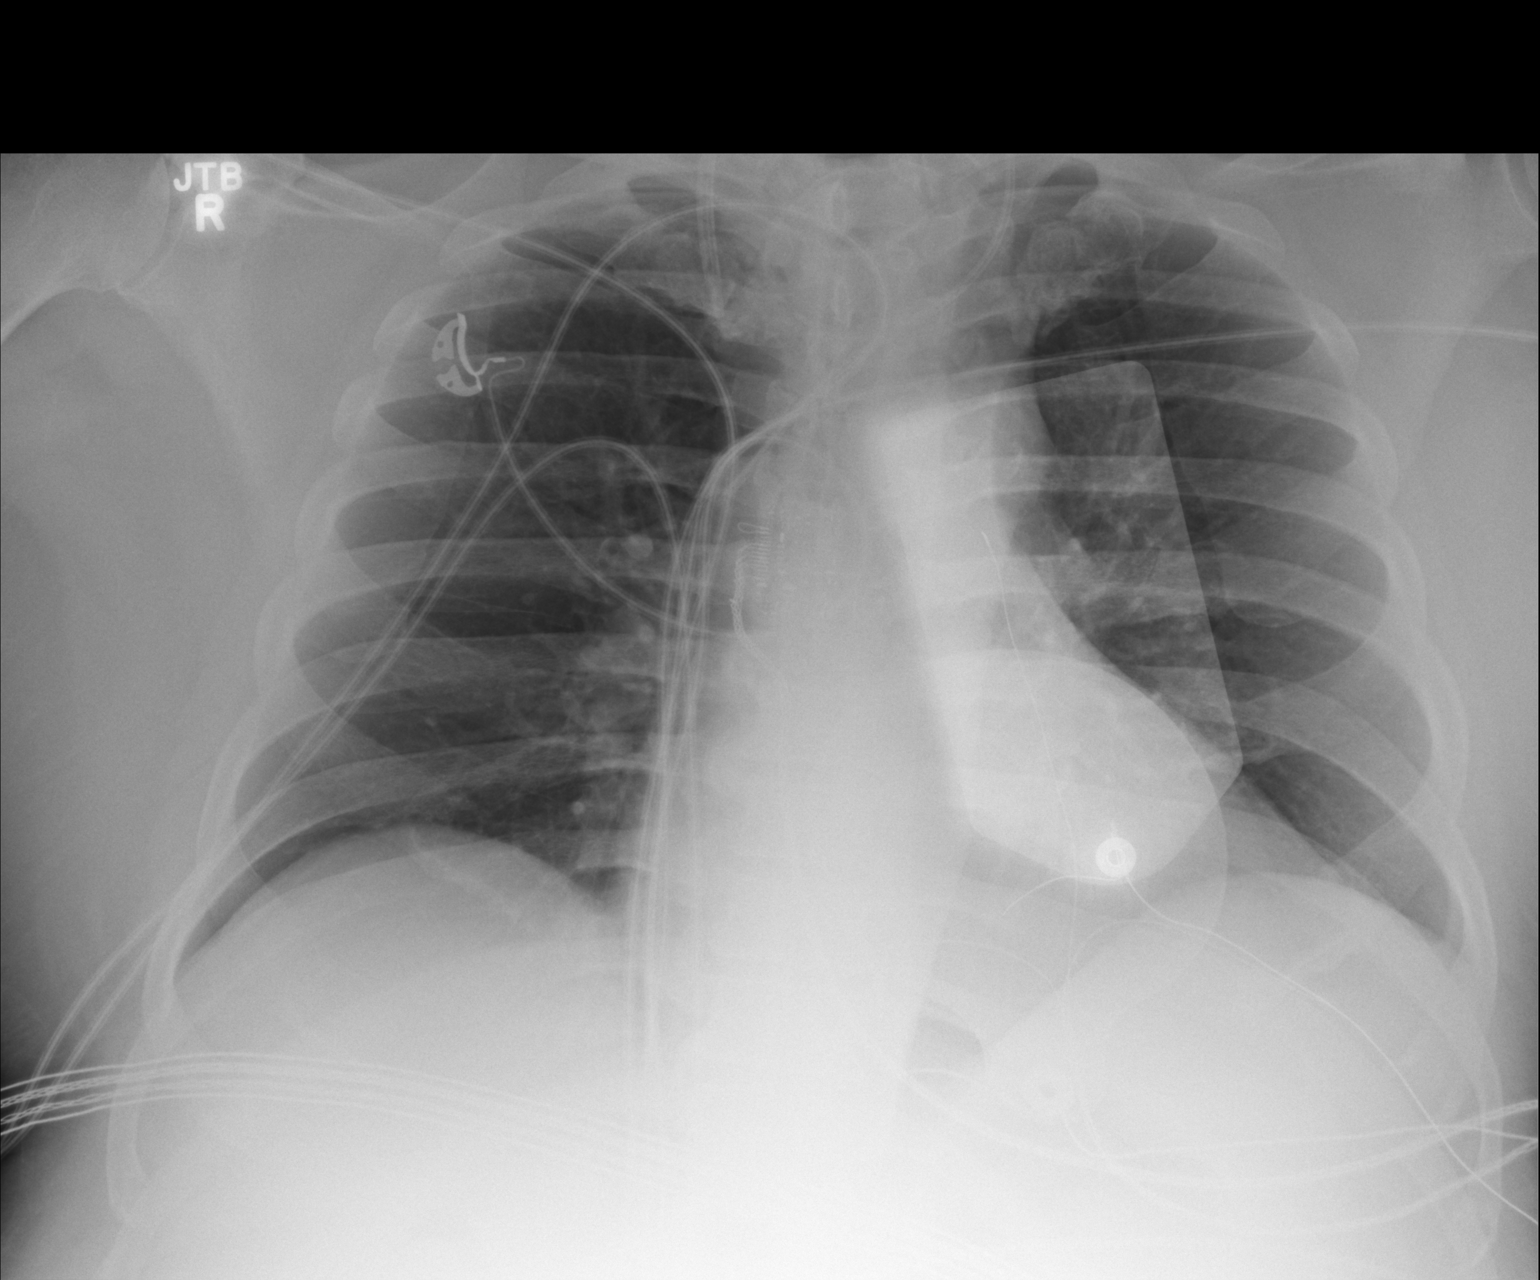

[1 of 1 positions shown; findings below may reference images not displayed]

FINDINGS: The lungs are reasonably well inflated and clear. The cardiac
silhouette is mildly enlarged but stable. The pulmonary vascularity
is normal. There is a large caliber right internal jugular venous
catheter whose tip lies at the level of the junction of the right
and left brachiocephalic veins. The left internal jugular venous
catheter tip projects in the proximal SVC. There is no pleural
effusion or pneumothorax. External pacing/defibrillator pads are
present. The bony thorax is unremarkable.
IMPRESSION: There is no evidence of pneumonia. There is enlargement the cardiac
silhouette without evidence of pulmonary edema.
